# Patient Record
Sex: Male | Born: 1935 | Race: White | Hispanic: No | Marital: Married | Smoking: Former smoker
Health system: Southern US, Community
[De-identification: ages and names within clinical notes are randomized; demographics above are authoritative.]

## PROBLEM LIST (undated history)

## (undated) DIAGNOSIS — C3490 Malignant neoplasm of unspecified part of unspecified bronchus or lung: Secondary | ICD-10-CM

## (undated) DIAGNOSIS — F419 Anxiety disorder, unspecified: Secondary | ICD-10-CM

## (undated) DIAGNOSIS — E785 Hyperlipidemia, unspecified: Secondary | ICD-10-CM

## (undated) DIAGNOSIS — M1990 Unspecified osteoarthritis, unspecified site: Secondary | ICD-10-CM

## (undated) DIAGNOSIS — I2510 Atherosclerotic heart disease of native coronary artery without angina pectoris: Secondary | ICD-10-CM

## (undated) DIAGNOSIS — I739 Peripheral vascular disease, unspecified: Secondary | ICD-10-CM

## (undated) DIAGNOSIS — I10 Essential (primary) hypertension: Secondary | ICD-10-CM

## (undated) DIAGNOSIS — T7840XA Allergy, unspecified, initial encounter: Secondary | ICD-10-CM

## (undated) DIAGNOSIS — C7951 Secondary malignant neoplasm of bone: Secondary | ICD-10-CM

## (undated) HISTORY — PX: TRANSURETHRAL RESECTION OF PROSTATE: SHX73

## (undated) HISTORY — DX: Peripheral vascular disease, unspecified: I73.9

## (undated) HISTORY — DX: Hyperlipidemia, unspecified: E78.5

## (undated) HISTORY — DX: Essential (primary) hypertension: I10

## (undated) HISTORY — PX: HERNIA REPAIR: SHX51

## (undated) HISTORY — DX: Atherosclerotic heart disease of native coronary artery without angina pectoris: I25.10

## (undated) HISTORY — PX: ABDOMINAL AORTIC ANEURYSM REPAIR: SUR1152

## (undated) HISTORY — PX: CARDIAC CATHETERIZATION: SHX172

---

## 1998-08-28 ENCOUNTER — Ambulatory Visit (HOSPITAL_COMMUNITY): Admission: RE | Admit: 1998-08-28 | Discharge: 1998-08-28 | Payer: Self-pay | Admitting: Dentistry

## 1998-08-28 ENCOUNTER — Encounter: Payer: Self-pay | Admitting: Dentistry

## 2004-02-17 ENCOUNTER — Ambulatory Visit: Admission: RE | Admit: 2004-02-17 | Discharge: 2004-02-17 | Payer: Self-pay | Admitting: Vascular Surgery

## 2004-02-23 ENCOUNTER — Emergency Department (HOSPITAL_COMMUNITY): Admission: EM | Admit: 2004-02-23 | Discharge: 2004-02-23 | Payer: Self-pay | Admitting: Emergency Medicine

## 2004-02-26 ENCOUNTER — Ambulatory Visit (HOSPITAL_COMMUNITY): Admission: RE | Admit: 2004-02-26 | Discharge: 2004-02-26 | Payer: Self-pay | Admitting: Vascular Surgery

## 2004-03-08 ENCOUNTER — Inpatient Hospital Stay (HOSPITAL_BASED_OUTPATIENT_CLINIC_OR_DEPARTMENT_OTHER): Admission: RE | Admit: 2004-03-08 | Discharge: 2004-03-08 | Payer: Self-pay | Admitting: *Deleted

## 2004-03-10 ENCOUNTER — Encounter (INDEPENDENT_AMBULATORY_CARE_PROVIDER_SITE_OTHER): Payer: Self-pay | Admitting: Specialist

## 2004-03-10 ENCOUNTER — Inpatient Hospital Stay (HOSPITAL_COMMUNITY): Admission: RE | Admit: 2004-03-10 | Discharge: 2004-03-16 | Payer: Self-pay | Admitting: Vascular Surgery

## 2004-06-01 ENCOUNTER — Ambulatory Visit: Payer: Self-pay | Admitting: Internal Medicine

## 2004-06-30 ENCOUNTER — Ambulatory Visit: Payer: Self-pay | Admitting: Internal Medicine

## 2004-08-11 ENCOUNTER — Ambulatory Visit: Payer: Self-pay | Admitting: Internal Medicine

## 2004-09-22 ENCOUNTER — Ambulatory Visit: Payer: Self-pay | Admitting: Internal Medicine

## 2004-10-06 ENCOUNTER — Ambulatory Visit: Payer: Self-pay | Admitting: Internal Medicine

## 2004-12-22 ENCOUNTER — Ambulatory Visit: Payer: Self-pay | Admitting: Internal Medicine

## 2005-02-02 ENCOUNTER — Ambulatory Visit: Payer: Self-pay | Admitting: Internal Medicine

## 2005-02-18 ENCOUNTER — Ambulatory Visit: Payer: Self-pay | Admitting: Internal Medicine

## 2005-03-28 ENCOUNTER — Ambulatory Visit: Payer: Self-pay | Admitting: Internal Medicine

## 2005-06-23 ENCOUNTER — Ambulatory Visit: Payer: Self-pay | Admitting: Internal Medicine

## 2005-06-29 ENCOUNTER — Ambulatory Visit: Payer: Self-pay | Admitting: Internal Medicine

## 2005-10-26 ENCOUNTER — Ambulatory Visit: Payer: Self-pay | Admitting: Internal Medicine

## 2006-03-22 ENCOUNTER — Ambulatory Visit: Payer: Self-pay | Admitting: Internal Medicine

## 2006-04-12 ENCOUNTER — Ambulatory Visit: Payer: Self-pay | Admitting: Internal Medicine

## 2006-04-12 LAB — CONVERTED CEMR LAB
ALT: 28 units/L (ref 0–40)
AST: 26 units/L (ref 0–37)
Albumin: 4.2 g/dL (ref 3.5–5.2)
Bilirubin, Direct: 0.1 mg/dL (ref 0.0–0.3)
Calcium: 9.4 mg/dL (ref 8.4–10.5)
Chloride: 106 meq/L (ref 96–112)
GFR calc non Af Amer: 69 mL/min
Glomerular Filtration Rate, Af Am: 84 mL/min/{1.73_m2}
Glucose, Bld: 101 mg/dL — ABNORMAL HIGH (ref 70–99)
PSA: 0.36 ng/mL (ref 0.10–4.00)
Platelets: 134 10*3/uL — ABNORMAL LOW (ref 150–400)
RBC: 5.38 M/uL (ref 4.22–5.81)
RDW: 12.2 % (ref 11.5–14.6)
Sodium: 142 meq/L (ref 135–145)

## 2006-10-11 ENCOUNTER — Ambulatory Visit: Payer: Self-pay | Admitting: Internal Medicine

## 2006-12-26 ENCOUNTER — Encounter: Payer: Self-pay | Admitting: Internal Medicine

## 2006-12-26 ENCOUNTER — Ambulatory Visit: Payer: Self-pay | Admitting: Internal Medicine

## 2006-12-26 DIAGNOSIS — E785 Hyperlipidemia, unspecified: Secondary | ICD-10-CM

## 2006-12-26 DIAGNOSIS — I739 Peripheral vascular disease, unspecified: Secondary | ICD-10-CM

## 2006-12-26 DIAGNOSIS — I2510 Atherosclerotic heart disease of native coronary artery without angina pectoris: Secondary | ICD-10-CM

## 2006-12-26 DIAGNOSIS — I10 Essential (primary) hypertension: Secondary | ICD-10-CM | POA: Insufficient documentation

## 2006-12-26 HISTORY — DX: Essential (primary) hypertension: I10

## 2006-12-26 HISTORY — DX: Atherosclerotic heart disease of native coronary artery without angina pectoris: I25.10

## 2006-12-26 HISTORY — DX: Hyperlipidemia, unspecified: E78.5

## 2006-12-26 HISTORY — DX: Peripheral vascular disease, unspecified: I73.9

## 2007-03-14 ENCOUNTER — Telehealth (INDEPENDENT_AMBULATORY_CARE_PROVIDER_SITE_OTHER): Payer: Self-pay | Admitting: *Deleted

## 2007-04-23 ENCOUNTER — Ambulatory Visit: Payer: Self-pay | Admitting: Internal Medicine

## 2007-06-04 ENCOUNTER — Ambulatory Visit: Payer: Self-pay | Admitting: Vascular Surgery

## 2007-10-29 ENCOUNTER — Ambulatory Visit: Payer: Self-pay | Admitting: Internal Medicine

## 2008-03-12 ENCOUNTER — Telehealth: Payer: Self-pay | Admitting: Internal Medicine

## 2008-04-24 ENCOUNTER — Telehealth: Payer: Self-pay | Admitting: Internal Medicine

## 2008-05-05 ENCOUNTER — Telehealth: Payer: Self-pay | Admitting: Internal Medicine

## 2008-05-08 ENCOUNTER — Telehealth: Payer: Self-pay | Admitting: Internal Medicine

## 2008-05-27 ENCOUNTER — Ambulatory Visit: Payer: Self-pay | Admitting: Internal Medicine

## 2008-07-31 ENCOUNTER — Ambulatory Visit: Payer: Self-pay | Admitting: Internal Medicine

## 2008-07-31 DIAGNOSIS — J069 Acute upper respiratory infection, unspecified: Secondary | ICD-10-CM | POA: Insufficient documentation

## 2008-10-22 ENCOUNTER — Ambulatory Visit: Payer: Self-pay | Admitting: Internal Medicine

## 2009-03-12 ENCOUNTER — Telehealth: Payer: Self-pay | Admitting: Internal Medicine

## 2009-03-27 ENCOUNTER — Encounter: Payer: Self-pay | Admitting: Internal Medicine

## 2009-06-01 ENCOUNTER — Ambulatory Visit: Payer: Self-pay | Admitting: Internal Medicine

## 2009-06-23 ENCOUNTER — Telehealth: Payer: Self-pay | Admitting: Internal Medicine

## 2009-11-30 ENCOUNTER — Ambulatory Visit: Payer: Self-pay | Admitting: Internal Medicine

## 2010-06-03 ENCOUNTER — Ambulatory Visit: Payer: Self-pay | Admitting: Internal Medicine

## 2010-06-03 ENCOUNTER — Encounter: Payer: Self-pay | Admitting: Internal Medicine

## 2010-06-03 LAB — CONVERTED CEMR LAB
ALT: 24 units/L (ref 0–53)
AST: 24 units/L (ref 0–37)
Albumin: 3.7 g/dL (ref 3.5–5.2)
BUN: 21 mg/dL (ref 6–23)
Basophils Relative: 0.3 % (ref 0.0–3.0)
Chloride: 105 meq/L (ref 96–112)
Cholesterol: 134 mg/dL (ref 0–200)
Eosinophils Relative: 1.6 % (ref 0.0–5.0)
GFR calc non Af Amer: 66.37 mL/min (ref >60.00)
HCT: 46.8 % (ref 39.0–52.0)
Hemoglobin: 16.1 g/dL (ref 13.0–17.0)
LDL Cholesterol: 67 mg/dL (ref 0–99)
Lymphs Abs: 1.4 10*3/uL (ref 0.7–4.0)
MCV: 91.8 fL (ref 78.0–100.0)
Monocytes Absolute: 1.2 10*3/uL — ABNORMAL HIGH (ref 0.1–1.0)
Monocytes Relative: 8.3 % (ref 3.0–12.0)
Neutro Abs: 11.2 10*3/uL — ABNORMAL HIGH (ref 1.4–7.7)
Potassium: 3.9 meq/L (ref 3.5–5.1)
Sodium: 143 meq/L (ref 135–145)
TSH: 1.54 microintl units/mL (ref 0.35–5.50)
Total Protein: 6.2 g/dL (ref 6.0–8.3)
VLDL: 18.2 mg/dL (ref 0.0–40.0)
WBC: 14 10*3/uL — ABNORMAL HIGH (ref 4.5–10.5)

## 2010-07-18 LAB — CONVERTED CEMR LAB
ALT: 22 units/L (ref 0–53)
ALT: 24 units/L (ref 0–53)
ALT: 26 units/L (ref 0–53)
AST: 25 units/L (ref 0–37)
AST: 28 units/L (ref 0–37)
Albumin: 3.9 g/dL (ref 3.5–5.2)
Albumin: 3.9 g/dL (ref 3.5–5.2)
Albumin: 4.1 g/dL (ref 3.5–5.2)
Alkaline Phosphatase: 56 units/L (ref 39–117)
Alkaline Phosphatase: 60 units/L (ref 39–117)
Alkaline Phosphatase: 69 units/L (ref 39–117)
BUN: 19 mg/dL (ref 6–23)
BUN: 25 mg/dL — ABNORMAL HIGH (ref 6–23)
Basophils Absolute: 0 10*3/uL (ref 0.0–0.1)
Basophils Relative: 0.3 % (ref 0.0–3.0)
Basophils Relative: 0.5 % (ref 0.0–3.0)
Bilirubin, Direct: 0.1 mg/dL (ref 0.0–0.3)
Bilirubin, Direct: 0.2 mg/dL (ref 0.0–0.3)
CO2: 32 meq/L (ref 19–32)
Calcium: 9.5 mg/dL (ref 8.4–10.5)
Chloride: 103 meq/L (ref 96–112)
Chloride: 104 meq/L (ref 96–112)
Chloride: 106 meq/L (ref 96–112)
Cholesterol: 137 mg/dL (ref 0–200)
Creatinine, Ser: 1.1 mg/dL (ref 0.4–1.5)
Eosinophils Absolute: 0.1 10*3/uL (ref 0.0–0.6)
Eosinophils Absolute: 0.2 10*3/uL (ref 0.0–0.7)
Eosinophils Absolute: 0.2 10*3/uL (ref 0.0–0.7)
Eosinophils Relative: 2.1 % (ref 0.0–5.0)
Eosinophils Relative: 2.7 % (ref 0.0–5.0)
GFR calc Af Amer: 76 mL/min
GFR calc non Af Amer: 63 mL/min
GFR calc non Af Amer: 69 mL/min
Glucose, Bld: 92 mg/dL (ref 70–99)
HCT: 46.8 % (ref 39.0–52.0)
HDL: 45.8 mg/dL (ref >39.0)
HDL: 53.2 mg/dL (ref >39.0)
Hemoglobin: 16.4 g/dL (ref 13.0–17.0)
Hemoglobin: 16.7 g/dL (ref 13.0–17.0)
LDL Cholesterol: 61 mg/dL (ref 0–99)
LDL Cholesterol: 61 mg/dL (ref 0–99)
Lymphocytes Relative: 22.1 % (ref 12.0–46.0)
MCHC: 35.1 g/dL (ref 30.0–36.0)
MCV: 90.9 fL (ref 78.0–100.0)
MCV: 92 fL (ref 78.0–100.0)
MCV: 92.1 fL (ref 78.0–100.0)
Monocytes Absolute: 0.7 10*3/uL (ref 0.1–1.0)
Monocytes Relative: 10.4 % (ref 3.0–12.0)
Monocytes Relative: 10.8 % (ref 3.0–11.0)
Neutro Abs: 4.5 10*3/uL (ref 1.4–7.7)
Neutro Abs: 4.6 10*3/uL (ref 1.4–7.7)
Neutrophils Relative %: 68.8 % (ref 43.0–77.0)
PSA: 0.43 ng/mL (ref 0.10–4.00)
PSA: 0.48 ng/mL (ref 0.10–4.00)
Platelets: 116 10*3/uL — ABNORMAL LOW (ref 150–400)
Potassium: 4.1 meq/L (ref 3.5–5.1)
RBC: 5.08 M/uL (ref 4.22–5.81)
RBC: 5.1 M/uL (ref 4.22–5.81)
Sodium: 144 meq/L (ref 135–145)
Total CHOL/HDL Ratio: 2.8
Total CHOL/HDL Ratio: 3
Total Protein: 6.7 g/dL (ref 6.0–8.3)
Total Protein: 6.8 g/dL (ref 6.0–8.3)
Triglycerides: 102 mg/dL (ref 0.0–149.0)
Triglycerides: 115 mg/dL (ref 0–149)
Triglycerides: 83 mg/dL (ref 0–149)
WBC: 6.8 10*3/uL (ref 4.5–10.5)
WBC: 7 10*3/uL (ref 4.5–10.5)

## 2010-07-20 NOTE — Assessment & Plan Note (Signed)
Summary: 6 month rov/njr    Vital Signs:  Patient profile:   77 year old male Weight:      177 pounds Temp:     97.6 degrees F oral BP sitting:   100 / 60  (right arm) Cuff size:   regular  Vitals Entered By: Duard Brady LPN (November 30, 2009 10:28 AM) CC: 6 mos rov - doing ok Is Patient Diabetic? No   CC:  6 mos rov - doing ok.  History of Present Illness: 77 year old patient who is in today for follow-up.  In.  He has a history of hypertension, coronary artery disease, peripheral that her occlusive disease and dyslipidemia.  He denies any exertional chest pain and denies any claudication.  His medications were reviewed.  Denies any orthostatic symptoms.  Blood pressure on arrival today was 100/60  Allergies (verified): No Known Drug Allergies  Past History:  Past Medical History: Reviewed history from 05/27/2008 and no changes required. Coronary artery disease history 80%  circumflex lesion via catheterizatio  2005 Hypertension Peripheral vascular disease Hyperlipidemia  Past Surgical History: Reviewed history from 05/27/2008 and no changes required. Abdominal aortic aneurysm repair September 2005 Transurethral resection of prostate Inguinal herniorrhaphy cardiac catheterization, September 2005  Family History: Reviewed history from 04/23/2007 and no changes required. both parents died at age 30 mother history hypertension, and diabetes two younger brothers are in good health  Review of Systems  The patient denies anorexia, fever, weight loss, weight gain, vision loss, decreased hearing, hoarseness, chest pain, syncope, dyspnea on exertion, peripheral edema, prolonged cough, headaches, hemoptysis, abdominal pain, melena, hematochezia, severe indigestion/heartburn, hematuria, incontinence, genital sores, muscle weakness, suspicious skin lesions, transient blindness, difficulty walking, depression, unusual weight change, abnormal bleeding, enlarged lymph nodes,  angioedema, breast masses, and testicular masses.    Physical Exam  General:  Well-developed,well-nourished,in no acute distress; alert,appropriate and cooperative throughout examination; 110/64 Head:  Normocephalic and atraumatic without obvious abnormalities. No apparent alopecia or balding. Eyes:  No corneal or conjunctival inflammation noted. EOMI. Perrla. Funduscopic exam benign, without hemorrhages, exudates or papilledema. Vision grossly normal. Mouth:  Oral mucosa and oropharynx without lesions or exudates.  Teeth in good repair. Neck:  No deformities, masses, or tenderness noted. Lungs:  Normal respiratory effort, chest expands symmetrically. Lungs are clear to auscultation, no crackles or wheezes. Heart:  Normal rate and regular rhythm. S1 and S2 normal without gallop, murmur, click, rub or other extra sounds. Abdomen:  Bowel sounds positive,abdomen soft and non-tender without masses, organomegaly or hernias noted. Msk:  No deformity or scoliosis noted of thoracic or lumbar spine.   Pulses:  R and L carotid,radial,femoral,dorsalis pedis and posterior tibial pulses are full and equal bilaterally Extremities:  No clubbing, cyanosis, edema, or deformity noted with normal full range of motion of all joints.   Skin:  Intact without suspicious lesions or rashes Cervical Nodes:  No lymphadenopathy noted Axillary Nodes:  No palpable lymphadenopathy   Impression & Recommendations:  Problem # 1:  HYPERLIPIDEMIA (ICD-272.4)  His updated medication list for this problem includes:    Simvastatin 20 Mg Tabs (Simvastatin) .Marland Kitchen... Take 1 tablet by mouth at bedtime  His updated medication list for this problem includes:    Simvastatin 20 Mg Tabs (Simvastatin) .Marland Kitchen... Take 1 tablet by mouth at bedtime  Problem # 2:  PERIPHERAL VASCULAR DISEASE (ICD-443.9)  Problem # 3:  HYPERTENSION (ICD-401.9)  His updated medication list for this problem includes:    Cardura 4 Mg Tabs (Doxazosin mesylate)  .Marland KitchenMarland KitchenMarland KitchenMarland Kitchen  1 once daily    Hydrochlorothiazide 25 Mg Tabs (Hydrochlorothiazide) .Marland Kitchen... Take 1/2  tablet by mouth once a day    Lopressor 100 Mg Tabs (Metoprolol tartrate) .Marland Kitchen... Take 1/2 tablet by mouth twice a day    His updated medication list for this problem includes:    Cardura 4 Mg Tabs (Doxazosin mesylate) .Marland Kitchen... 1 once daily    Hydrochlorothiazide 25 Mg Tabs (Hydrochlorothiazide) .Marland Kitchen... Take 1/2  tablet by mouth once a day    Lopressor 100 Mg Tabs (Metoprolol tartrate) .Marland Kitchen... Take 1/2 tablet by mouth twice a day  Problem # 4:  CORONARY ARTERY DISEASE (ICD-414.00)  His updated medication list for this problem includes:    Aspirin 81 Mg Tbec (Aspirin) .Marland Kitchen... Take 1 tablet by mouth every morning    Cardura 4 Mg Tabs (Doxazosin mesylate) .Marland Kitchen... 1 once daily    Hydrochlorothiazide 25 Mg Tabs (Hydrochlorothiazide) .Marland Kitchen... Take 1/2  tablet by mouth once a day    Lopressor 100 Mg Tabs (Metoprolol tartrate) .Marland Kitchen... Take 1/2 tablet by mouth twice a day    His updated medication list for this problem includes:    Aspirin 81 Mg Tbec (Aspirin) .Marland Kitchen... Take 1 tablet by mouth every morning    Cardura 4 Mg Tabs (Doxazosin mesylate) .Marland Kitchen... 1 once daily    Hydrochlorothiazide 25 Mg Tabs (Hydrochlorothiazide) .Marland Kitchen... Take 1/2  tablet by mouth once a day    Lopressor 100 Mg Tabs (Metoprolol tartrate) .Marland Kitchen... Take 1/2 tablet by mouth twice a day  Complete Medication List: 1)  Aspirin 81 Mg Tbec (Aspirin) .... Take 1 tablet by mouth every morning 2)  Cardura 4 Mg Tabs (Doxazosin mesylate) .Marland Kitchen.. 1 once daily 3)  Hydrochlorothiazide 25 Mg Tabs (Hydrochlorothiazide) .... Take 1/2  tablet by mouth once a day 4)  Lopressor 100 Mg Tabs (Metoprolol tartrate) .... Take 1/2 tablet by mouth twice a day 5)  Simvastatin 20 Mg Tabs (Simvastatin) .... Take 1 tablet by mouth at bedtime 6)  Temazepam 15 Mg Caps (Temazepam) .... One tab at bedtime as needed  Other Orders: Prescription Created Electronically [phone])  Patient  Instructions: 1)  Please schedule a follow-up appointment in 6 months for CPX  2)  Advised not to eat any food or drink any liquids after 10 PM the night before your procedure. 3)  Limit your Sodium (Salt) to less than 2 grams a day(slightly less than 1/2 a teaspoon) to prevent fluid retention, swelling, or worsening of symptoms. 4)  It is important that you exercise regularly at least 20 minutes 5 times a week. If you develop chest pain, have severe difficulty breathing, or feel very tired , stop exercising immediately and seek medical attention. Prescriptions: TEMAZEPAM 15 MG  CAPS (TEMAZEPAM) one tab at bedtime as needed  #90 x 4   Entered and Authorized by:   Gordy Savers  MD   Signed by:   Gordy Savers  MD on 11/30/2009   Method used:   Print then Give to Patient   RxID:   [id number] SIMVASTATIN 20 MG TABS (SIMVASTATIN) Take 1 tablet by mouth at bedtime  #90 x 6   Entered and Authorized by:   Gordy Savers  MD   Signed by:   Gordy Savers  MD on 11/30/2009   Method used:   Electronically to        [facility]* (retail)             ,  Ph: [phone]       Fax: [phone]   RxID:   [id number] LOPRESSOR 100 MG TABS (METOPROLOL TARTRATE) Take 1/2 tablet by mouth twice a day  #90 x 6   Entered and Authorized by:   Gordy Savers  MD   Signed by:   Gordy Savers  MD on 11/30/2009   Method used:   Electronically to        [facility]* (retail)             ,          Ph: [phone]       Fax: [phone]   RxID:   [id number] HYDROCHLOROTHIAZIDE 25 MG TABS (HYDROCHLOROTHIAZIDE) Take 1/2  tablet by mouth once a day  #90 x 6   Entered and Authorized by:   Gordy Savers  MD   Signed by:   Gordy Savers  MD on 11/30/2009   Method used:   Electronically to        [facility]* (retail)             ,          Ph: [phone]       Fax: [phone]   RxID:   [id number] CARDURA 4 MG TABS  (DOXAZOSIN MESYLATE) 1 once daily  #90 x 6   Entered and Authorized by:   Gordy Savers  MD   Signed by:   Gordy Savers  MD on 11/30/2009   Method used:   Electronically to        [facility]* (retail)             ,          Ph: [phone]       Fax: [phone]   RxID:   [id number]

## 2010-07-20 NOTE — Progress Notes (Signed)
Summary: [facility] fax rx  Phone Note Call from Patient Call back at [phone]   Caller: Patient Call For: Gavin Savers  MD Summary of Call: pt needs name brand cardura 4 mg no generic fax into [facility] [phone] Initial call taken by: Heron Sabins,  June 23, 2009 4:07 PM    Prescriptions: CARDURA 4 MG TABS (DOXAZOSIN MESYLATE) 1 once daily  #90 x 3   Entered by:   Raechel Ache, RN   Authorized by:   Gavin Savers  MD   Signed by:   Raechel Ache, RN on 06/24/2009   Method used:   Printed then faxed to ...       [facility]* (mail-order)             ,          Ph: [phone]       Fax: [phone]   RxID:   [id number]

## 2010-07-22 NOTE — Assessment & Plan Note (Signed)
Summary: emp/pt coming in fasting/cjr   Vital Signs:  Patient profile:   77 year old male Height:      65.25 inches Weight:      179 pounds Temp:     97.9 degrees F oral BP sitting:   116 / 70  (right arm) Cuff size:   regular  Vitals Entered By: Duard Brady LPN (June 03, 2010 9:21 AM) CC: cpx- doing ok Is Patient Diabetic? No   CC:  cpx- doing ok.  History of Present Illness: 77 year old patient who is seen today for a wellness exam.  Medical problems include dyslipidemia, peripheral vascular disease, hypertension, and coronary artery disease.  He is now well tonight without any cardiopulmonary complaints.  Medical regimen includes simvastatin, which he tolerates well.\par  Here for Medicare AWV:  1.   Risk factors based on Past M, S, F history: patient has known coronary artery disease.  Risk factors include peripheral vascular disease, hypertension, and dyslipidemia 2.   Physical Activities: remains very active.  Continues to play golf occasionally 3.   Depression/mood: history depression, or mood disorder 4.   Hearing: wears a hearing aid bilaterally 5.   ADL's: independent in all aspects of daily living 6.   Fall Risk: low 7.   Home Safety: no problems identified 8.   Height, weight, &visual acuity:height and weight are stable.  No change in visual acuity 9.   Counseling: heart healthy diet more regular exercise and modest weight loss.  All encouraged 10.   Labs ordered based on risk factors: laboratory profile, including lipid panel will be reviewed 11.           Referral Coordination- not appropriate at this time 12.           Care Plan- heart healthy diet, exercise, weight loss encouraged 13.            Cognitive Assessment- alert and oriented, with normal affect.  No cognitive dysfunction.  handles all executive functions in the household   Allergies (verified): No Known Drug Allergies  Past History:  Past Medical History: Reviewed history from  05/27/2008 and no changes required. Coronary artery disease history 80%  circumflex lesion via catheterizatio  2005 Hypertension Peripheral vascular disease Hyperlipidemia  Past Surgical History: Reviewed history from 05/27/2008 and no changes required. Abdominal aortic aneurysm repair September 2005 Transurethral resection of prostate Inguinal herniorrhaphy cardiac catheterization, September 2005  Family History: Reviewed history from 04/23/2007 and no changes required. both parents died at age 91 mother history hypertension, and diabetes two younger brothers are in good health  Social History: Reviewed history from 05/27/2008 and no changes required. Married active with golf  Review of Systems  The patient denies anorexia, fever, weight loss, weight gain, vision loss, decreased hearing, hoarseness, chest pain, syncope, dyspnea on exertion, peripheral edema, prolonged cough, headaches, hemoptysis, abdominal pain, melena, hematochezia, severe indigestion/heartburn, hematuria, incontinence, genital sores, muscle weakness, suspicious skin lesions, transient blindness, difficulty walking, depression, unusual weight change, abnormal bleeding, enlarged lymph nodes, angioedema, breast masses, and testicular masses.    Physical Exam  General:  Well-developed,well-nourished,in no acute distress; alert,appropriate and cooperative throughout examination; blood pressure 116/72 Head:  Normocephalic and atraumatic without obvious abnormalities. No apparent alopecia or balding. Eyes:  No corneal or conjunctival inflammation noted. EOMI. Perrla. Funduscopic exam benign, without hemorrhages, exudates or papilledema. Vision grossly normal. Ears:  hearing aids  in place bilaterally; these were removed, canals and tympanic membranes unremarkable Nose:  External nasal examination shows no deformity  or inflammation. Nasal mucosa are pink and moist without lesions or exudates. Mouth:  Oral mucosa and  oropharynx without lesions or exudates.  Teeth in good repair. Neck:  No deformities, masses, or tenderness noted. Chest Wall:  No deformities, masses, tenderness or gynecomastia noted. Breasts:  No masses or gynecomastia noted Lungs:  Normal respiratory effort, chest expands symmetrically. Lungs are clear to auscultation, no crackles or wheezes. Heart:  Normal rate and regular rhythm. S1 and S2 normal without gallop, murmur, click, rub or other extra sounds. Abdomen:  Bowel sounds positive,abdomen soft and non-tender without masses, organomegaly or hernias noted.  midline scar with a ventral hernia Rectal:  No external abnormalities noted. Normal sphincter tone. No rectal masses or tenderness. Genitalia:  Testes bilaterally descended without nodularity, tenderness or masses. No scrotal masses or lesions. No penis lesions or urethral discharge. Prostate:  Prostate gland firm and smooth, no enlargement, nodularity, tenderness, mass, asymmetry or induration. Msk:  No deformity or scoliosis noted of thoracic or lumbar spine.   Pulses:  left dorsalis pedis pulse full.  Other pedal pulses not easily palpable Extremities:  No clubbing, cyanosis, edema, or deformity noted with normal full range of motion of all joints.   Neurologic:  No cranial nerve deficits noted. Station and gait are normal. Plantar reflexes are down-going bilaterally. DTRs are symmetrical throughout. Sensory, motor and coordinative functions appear intact. Skin:  Intact without suspicious lesions or rashes Cervical Nodes:  No lymphadenopathy noted Axillary Nodes:  No palpable lymphadenopathy Inguinal Nodes:  No significant adenopathy Psych:  Cognition and judgment appear intact. Alert and cooperative with normal attention span and concentration. No apparent delusions, illusions, hallucinations   Impression & Recommendations:  Problem # 1:  HEALTH SCREENING (ICD-V70.0)  Orders: Medicare -1st Annual Wellness Visit  [phone])  Complete Medication List: 1)  Aspirin 81 Mg Tbec (Aspirin) .... Take 1 tablet by mouth every morning 2)  Cardura 4 Mg Tabs (Doxazosin mesylate) .Marland Kitchen.. 1 once daily 3)  Hydrochlorothiazide 25 Mg Tabs (Hydrochlorothiazide) .... Take 1/2  tablet by mouth once a day 4)  Lopressor 100 Mg Tabs (Metoprolol tartrate) .... Take 1/2 tablet by mouth twice a day 5)  Simvastatin 20 Mg Tabs (Simvastatin) .... Take 1 tablet by mouth at bedtime 6)  Temazepam 15 Mg Caps (Temazepam) .... One tab at bedtime as needed  Other Orders: EKG w/ Interpretation (93000) Venipuncture (86578) TLB-Lipid Panel (80061-LIPID) TLB-BMP (Basic Metabolic Panel-BMET) (80048-METABOL) TLB-CBC Platelet - w/Differential (85025-CBCD) TLB-Hepatic/Liver Function Pnl (80076-HEPATIC) TLB-TSH (Thyroid Stimulating Hormone) (84443-TSH) Specimen Handling (46962)  Patient Instructions: 1)  Please schedule a follow-up appointment in 6 months. 2)  Limit your Sodium (Salt). 3)  It is important that you exercise regularly at least 20 minutes 5 times a week. If you develop chest pain, have severe difficulty breathing, or feel very tired , stop exercising immediately and seek medical attention. 4)  You need to lose weight. Consider a lower calorie diet and regular exercise.  Prescriptions: SIMVASTATIN 20 MG TABS (SIMVASTATIN) Take 1 tablet by mouth at bedtime  #90 x 6   Entered and Authorized by:   Gordy Savers  MD   Signed by:   Gordy Savers  MD on 06/03/2010   Method used:   Electronically to        [facility]* (retail)             ,          Ph: [phone]       Fax:  [phone]   RxID:   [id number] LOPRESSOR 100 MG TABS (METOPROLOL TARTRATE) Take 1/2 tablet by mouth twice a day  #90 x 6   Entered and Authorized by:   Gordy Savers  MD   Signed by:   Gordy Savers  MD on 06/03/2010   Method used:   Electronically to        [facility]* (retail)             ,          Ph:  [phone]       Fax: [phone]   RxID:   [id number] HYDROCHLOROTHIAZIDE 25 MG TABS (HYDROCHLOROTHIAZIDE) Take 1/2  tablet by mouth once a day  #90 x 6   Entered and Authorized by:   Gordy Savers  MD   Signed by:   Gordy Savers  MD on 06/03/2010   Method used:   Electronically to        [facility]* (retail)             ,          Ph: [phone]       Fax: [phone]   RxID:   [id number] CARDURA 4 MG TABS (DOXAZOSIN MESYLATE) 1 once daily Brand medically necessary #90 Tablet x 2   Entered and Authorized by:   Gordy Savers  MD   Signed by:   Gordy Savers  MD on 06/03/2010   Method used:   Electronically to        [facility]* (retail)             ,          Ph: [phone]       Fax: [phone]   RxID:   [id number]    Orders Added: 1)  EKG w/ Interpretation [93000] 2)  Medicare -1st Annual Wellness Visit [G0438] 3)  Est. Patient Level III [85462] 4)  Venipuncture [36415] 5)  TLB-Lipid Panel [80061-LIPID] 6)  TLB-BMP (Basic Metabolic Panel-BMET) [80048-METABOL] 7)  TLB-CBC Platelet - w/Differential [85025-CBCD] 8)  TLB-Hepatic/Liver Function Pnl [80076-HEPATIC] 9)  TLB-TSH (Thyroid Stimulating Hormone) [84443-TSH] 10)  Specimen Handling [99000]

## 2010-10-05 ENCOUNTER — Other Ambulatory Visit: Payer: Self-pay | Admitting: Internal Medicine

## 2010-10-05 MED ORDER — TEMAZEPAM 15 MG PO CAPS: 15.0000 mg | ORAL_CAPSULE | Freq: Every evening | ORAL | Status: DC | PRN

## 2010-10-05 NOTE — Telephone Encounter (Signed)
Pt called and said that Temazepam 15 mg, needs to be sent in to [facility] mail order pharmacy asap. Pt also req lab result and would like to pick up asap.

## 2010-10-05 NOTE — Telephone Encounter (Signed)
Spoke to pt - rx's for both him and wife faxed to [facility]

## 2010-11-05 NOTE — Op Note (Signed)
NAME:  [name], [name]               ACCOUNT NO.:  [id number]   MEDICAL RECORD NO.:  [id number]          PATIENT TYPE:  INP   LOCATION:  2314                         FACILITY:  [facility]   PHYSICIAN:  Quita Skye. Hart Rochester, M.D.  DATE OF BIRTH:  1928-12-02   DATE OF PROCEDURE:  03/10/2004  DATE OF DISCHARGE:                                 OPERATIVE REPORT   /PREOPERATIVE DIAGNOSES/>  1.  Abdominal aortic aneurysm..  2.  Bilateral iliac aneurysms.   POSTOPERATIVE DIAGNOSES:  1.  Abdominal aortic aneurysm..  2.  Bilateral iliac aneurysms.   OPERATION:  Resection and grafting of abdominal aortic and bilateral iliac  aneurysms with insertion of an aorta-to-left-external-iliac and aorta-to-  right-internal-iliac-artery bypass with reimplantation of right external  artery into the right limb of the aortic graft (14 x 8-mm Hemashield).   SURGEON:  Quita Skye. Hart Rochester, M.D.   FIRST ASSISTANT:  Larina Earthly, M.D.   SECOND ASSISTANT:  Pecola Leisure, [place]   ANESTHESIA:  General endotracheal.   PROCEDURE:  The patient was taken to the operating room and placed in a  supine position, at which time satisfactory general endotracheal anesthesia  was administered.  Abdomen and groins were prepped with Betadine scrubbing  solution and draped in routine sterile manner after insertion of a radial  arterial line and Swan-Ganz catheter by anesthesia.  A midline incision was  made in the abdomen through the linea alba from the xiphoid to pubis,  carried through subcutaneous tissue and the linea alba using the Bovie.  Peritoneal cavity was entered and thoroughly explored.  The stomach,  duodenum, small bowel and colon were unremarkable.  The liver was smooth  with no palpable masses.  The gallbladder was small, but no stones were  palpable.  Transverse colon was elevated and intestines reflected to the  right side; retroperitoneum was exposed, where there was a large infrarenal  aortic aneurysm approximately  5.5 cm in diameter and a large right common  iliac aneurysm which extended down to and involved the origin of the  internal iliac artery.  The left common iliac artery had a smaller aneurysm,  being only about 2.5 in diameter, the right one being about 4 cm in  diameter.  The left external iliac artery was exposed in the pelvis  distally, circumferential control of the left common iliac artery was  obtained and the right internal and external iliac arteries were exposed  through the midline incision proximal to the ureter.  The patient was then  given 25 g of mannitol and heparinized.  Aorta was occluded distal to the  renal arteries, the left common iliac artery ligated just proximal to the  internal iliac artery, which was noted to be occluded on the angiogram.  The  aneurysm was opened anteriorly and a large amount of laminated thrombus  removed with lumbars oversewn with 2-0 silk figure-of-eight sutures from  within.  There was also a large amount of laminated thrombus in the right  common iliac artery aneurysm which was removed.  There was significant  disease down to and involving the origin of  the internal iliac artery,  making a bypass into both vessels with 1 graft not feasible.  There was  concern over the pelvic circulation with the left internal iliac artery  being thrombosed and the right being involved with the disease, therefore,  it was decided to revascularize both on the right side.  A 14 x 8-mm  Hemashield Dacron graft was anastomosed end-to-end to the aortic stump using  continuous 3-0 Prolene, buttressing this with a strip of felt.  This was  checked for leaks and none were present.  On the left side, the graft was  delivered posterior to the ureter, anastomosis end-to-side to the left  external iliac artery with 5-0 Prolene, left leg opened with no significant  hypotension.  On the right side, the internal and external iliac arteries  were both preserved and slightly  spatulated, and the right limb of the  aortic graft was anastomosis end-to-end to the right internal iliac artery  with continuous 5-0 Prolene.  The right external iliac artery having been  spatulated was then anastomosed end-to-side to the anterior aspect of the  right limb of the aortobifemoral graft with 5-0 Prolene.  Clamps were all  released and there was an excellent pulse in all areas.  Inferior mesenteric  artery had retrograde pulsatile flow when this was completed and it was  ligated with 2-0 silk tie.  One hundred milligrams of Protamine were given  to reverse the heparin and following adequate hemostasis, aneurysm closed  over the graft with 3-0 Vicryl, retroperitoneum closed with 3-0 Vicryl,  linea alba with #1 Prolene and the skin with clips, a sterile dressing  applied and patient taken to the recovery room in satisfactory condition.  He was stable hemodynamically throughout the case, received no blood from  the blood blank and had an excellent urinary output.       JDL/MEDQ  D:  03/10/2004  T:  03/11/2004  Job:  [id number]

## 2010-11-05 NOTE — Assessment & Plan Note (Signed)
 HEALTHCARE                              [facility] OFFICE NOTE   NAME:[name], [name]                      MRN:          [id number]  DATE:04/12/2006                            DOB:          07/11/1928   A 77 year old gentleman who was seen today for an annual exam.  He does  remarkably well.  He is now approximately 2 years status post triple A  repair. He has a history of 1 vessel coronary artery disease with 80%  circumflex lesion.  He is doing well clinically. He has hypertension, on  statin therapy.  Surgical history include remote TURP in 1994 by Dr.  Patsi Sears.  He has had a hernia repair by Dr. Maryagnes Amos, heart catheterization  was at the time of his aneurysmectomy in September 2005.  He denies any  cardiopulmonary complaints. He remains quite active with golf twice weekly.   REVIEW OF SYSTEMS:  Otherwise unremarkable. Did have sigmoidoscopy in 2002.   FAMILY HISTORY:  Unchanged.  Both parents died at 53.  Two brothers remain  well.   EXAMINATION:  Revealed a well-developed male who appeared younger than his  stated age.  Blood pressure is low normal.  SKIN:  Negative.  Fundi, ear, nose and throat clear.  NECK:  No bruits or adenopathy.  CHEST: Was clear.  CARDIOVASCULAR:  Normal heart sounds, no murmurs.  ABDOMEN:  Benign.  No bruits.  External genitalia normal.  RECTAL:  Prostate minimally enlarged.  Stool heme negative.  EXTREMITIES:  Revealed no significant edema. Peripheral pulses were slight  diminished, left greater than the right.   IMPRESSION:  1. Peripheral vascular occlusive disease status post triple A repair.  2. Coronary artery disease.  3. Hypertension.  4. Statin therapy.   DISPOSITION:  1. Medical regimen unchanged.  2. Reassessment in 6 months.  3. Laboratory update reviewed.            ______________________________  Gordy Savers, MD   PFK/MedQ DD:  04/12/2006 DT:  04/13/2006 Job #:  [id number]

## 2010-11-05 NOTE — Discharge Summary (Signed)
[name], [name] NO.:  [id number]   MEDICAL RECORD NO.:  [id number]          PATIENT TYPE:  INP   LOCATION:  2036                         FACILITY:  [facility]   PHYSICIAN:  [name], M.D.  DATE OF BIRTH:  05/20/1929   DATE OF ADMISSION:  03/10/2004  DATE OF DISCHARGE:                                 DISCHARGE SUMMARY   ADMITTING DIAGNOSIS:  Asymptomatic abdominal aortic aneurysm.   PAST MEDICAL HISTORY/DISCHARGE DIAGNOSES:  1.  Asymptomatic abdominal aortic aneurysm, status post resection grafting      of abdominal aortic aneurysm with 14 x 8 mm Hemashield graft as well as      resection grafting of right iliac aneurysm.  2.  Hypertension.  3.  Right inguinal hernia repair approximately 15 years ago.  4.  TURP approximately 11 years ago.   ALLERGIES:  No known drug allergies.   BRIEF HISTORY:  The patient is a 77 year old male referred by Dr.  Amador Sutton to Dr. Hart Sutton for evaluation of an asymptomatic abdominal aortic  aneurysm.  The patient was found to have an AAA on routine physical exam  approximately one month prior to admission.  An ultrasound was performed  which confirmed a 6.2 x 5.6 cm aneurysm.  The patient has been asymptomatic  and is very active.   Dr. Hart Sutton recommended an arteriogram, which he performed on February 26, 2004 with the following findings:  An infrarenal abdominal aortic aneurysm, bilateral common iliac artery  aneurysms, occlusion of the left internal iliac artery and severe disease  and occlusion of the right posterior tibial artery.  It was Dr. Candie Sutton  opinion that the patient should proceed with the elective resection grafting  of these aneurysms.   HOSPITAL COURSE:  The patient was admitted and taken to the OR on March 10, 2004 for resection grafting of abdominal aortic and bilateral iliac  aneurysms with insertion of an aortic to the left external iliac and aortic  to right internal iliac artery bypass  with re-implantation of the right  external artery into the right limb of the aortic graft.  A 14 x 8 mm  Hemashield graft was used.  The patient tolerated the procedure well and was  hemodynamically stable immediately postoperatively.  The patient was  transferred from the OR to the post anesthesia care unit in stable  condition.  The patient was extubated without complications and woke up from  anesthesia neurologically intact.   The patient's postoperative course did progress as expected.  Immediately  postoperatively the patient was awake, but drowsy, and he was doing well  after the surgery.  The patient had 2+ posterior tibial pulses.   On postoperative day one, the patient again had 2 to 3+ palpable posterior  tibial pulses bilaterally.  The patient's urine output was excellent and his  NG tube was discontinued.  The patient was kept n.p.o. for the time being.  His __________ was discontinued and the patient was able to move out of bed  to the chair.   The remainder of the patient's postoperative course has been uneventful.  The patient's diet was advanced and he tolerated this well.  The patient was  also ambulating well in hallways without difficulty.  The patient's bowel  function has returned and his only complaint has been some mild left lower  quadrant soreness.   On postoperative day five, the patient had return of bowel function and was  tolerating a full diet.  He is afebrile and vital signs are stable.   PHYSICAL EXAMINATION:  CARDIAC:  Regular rate and rhythm.  LUNGS:  Clear to auscultation bilaterally.  ABDOMEN:  Soft, nontender and nondistended.  There are active bowel sounds  in four quadrants.  The incision is clean, dry and intact and there are 2+  palpable posterior tibial pulses present bilaterally.   The patient is in stable condition and is making good progress.  He is  tolerating his diet and ambulating without difficulty.  The patient's  Cardura was  restarted at this time.  As long as the patient continues to  progress in the current manner and remains in stable condition, he will be  ready for discharge within the next one to two days pending morning round  reevaluation.   LABORATORIES:  CBC and BMP on March 15, 2004:  White count 6.9,  hemoglobin 10.5, hematocrit 29.8, platelets 186, sodium 142, potassium 3.6,  BUN 13, creatinine 0.9, glucose 124.   CONDITION ON DISCHARGE:  Improved.   INSTRUCTIONS:  Medications:  1.  The patient is to resume his Cardura 4 mg p.o. q. day.  2.  Tylox one to two p.o. q. 4 to 6h. p.r.n. pain.   ACTIVITY:  No driving and the patient is to continue daily breathing and  walking exercises.   DIET:  Low-salt, low-fat.   WOUND CARE:  The patient may shower daily and clean the incisions with soap  and water.  If the incisions become red, swollen or drain or if the patient  has a fever of 101 degrees Fahrenheit, he is to contact the [facility] office at  [phone].   FOLLOW-UP APPOINTMENTS:  1.  Staple removal with the nurse at [facility] on March 22, 2004 at 9:30 a.m.  2.  Dr. Hart Sutton on April 06, 2004 at 10:30 a.m.       AY/MEDQ  D:  03/15/2004  T:  03/15/2004  Job:  [id number]   cc:   [name], N.M.  [address]  [place]  [place] [address]  Fax: [phone]   [name], M.D. [facility]

## 2010-11-05 NOTE — Cardiovascular Report (Signed)
NAME:  GEHRIG, PATRAS                         ACCOUNT NO.:  [id number]   MEDICAL RECORD NO.:  [id number]                   PATIENT TYPE:  OIB   LOCATION:  6501                                 FACILITY:  [facility]   PHYSICIAN:  Charlies Constable, M.D. [facility]              DATE OF BIRTH:  10-30-1928   DATE OF PROCEDURE:  03/08/2004  DATE OF DISCHARGE:                              CARDIAC CATHETERIZATION   CLINICAL HISTORY:  Mr. Jeschke is 77 years old and has no prior history of  known heart disease.  He was recently found to have an abdominal aneurysm on  exam by Dr. Amador Cunas which subsequently measured 6.2 x 5.6 cm.  We were  asked to see him for preop evaluation and he had a Cardiolite scan which  suggested inferior ischemia.  We brought him in today for evaluation with  angiography in the outpatient laboratory.  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   PROCEDURE:  The procedure was performed via the right femoral artery using  arterial sheath and 6 French preformed coronary catheters. A front wall  arterial puncture was performed and Omnipaque contrast was used.  The  patient tolerated the procedure well and left the laboratory in satisfactory  condition.   RESULTS:  Left main coronary artery:  The left main coronary artery had a  30% distal stenosis.   Left anterior descending artery:  The left anterior descending artery gave  rise to two diagonal branches and four septal perforators.  There were  irregularities in the LAD, but there was no significant obstruction.   Circumflex artery:  The circumflex artery gave rise to an atrial branch,  marginal branch and a small posterior lateral branch.  There was 80% ostial  stenosis in the circumflex artery.   Right coronary artery:  The right coronary artery was a moderate size vessel  and gave rise to a conus branch, right ventricular branch, second acute  marginal branch and supplied part of the inferior septum,  short posterior  descending branch and three posterior lateral branches.  These vessels were  free of significant disease.   LEFT VENTRICULOGRAM:  The left ventriculogram performed in the RAO  projection showed good wall motion with no areas of hypokinesis.  The  estimated ejection fraction was 55%.   HEMODYNAMIC DATA:  The aortic pressure was 138/79 with mean of 105, left  ventricular pressure was 138/6.   CONCLUSIONS:  Coronary artery disease with 30% narrowing of the distal left  main, irregularities in the left anterior descending artery, 80% ostial  stenosis in the circumflex artery, no significant obstruction in the right  coronary artery and normal LV function.   RECOMMENDATIONS:  The lesion in the ostium of the circumflex artery is not  particularly favorable for PCI.  There is no clear evidence that  revascularization will decrease his risk prior to surgery for his  abdominal  aortic aneurysm.  Since he is not having any symptoms, I would recommend  medical therapy for his coronary disease and recommend proceeding with  abdominal aneurysm surgery giving beta blocker pre and postoperatively.      BB/MEDQ  D:  03/08/2004  T:  03/08/2004  Job:  [id number]   cc:   Gordy Savers, M.D. [facility]   Fayrene Fearing D. Hart Rochester, M.D.  [address]  [place]  [place] [address]

## 2010-11-05 NOTE — H&P (Signed)
NAME:  [name], [name]                         ACCOUNT NO.:  [id number]   MEDICAL RECORD NO.:  [id number]                   PATIENT TYPE:  INP   LOCATION:  NA                                   FACILITY:  [facility]   PHYSICIAN:  Quita Skye. Hart Rochester, M.D.               DATE OF BIRTH:  October 16, 1928   DATE OF ADMISSION:  03/03/2004  DATE OF DISCHARGE:                                HISTORY & PHYSICAL   CHIEF COMPLAINT:  Asymptomatic abdominal aortic aneurysm.   HISTORY OF PRESENT ILLNESS:  This is a 77 year old male patient of Dr.  Amador Cunas referred to Dr. Hart Rochester for a surgical opinion.  The patient was  found approximately one month ago to have, on routine physical examination,  a large abdominal aortic aneurysm.  Ultrasound confirmed this as a 6.2 by  5.6 cm aneurysm.  The patient has been essentially asymptomatic.  He is very  active and has no signs of claudication, back pain, abdominal pain, nausea  and vomiting, constipation, hematochezia, hematemesis, peripheral edema,  dysuria, hematuria, reflux, shortness of breath, dyspnea on exertion, or  chest pain.  Arteriogram performed by Dr. Hart Rochester on February 26, 2004,  showed several findings:  Infrarenal abdominal aortic aneurysm, bilateral  common iliac artery aneurysms, occlusion of the left internal iliac artery,  severe disease and occlusion of the right posterior tibial artery.  It was  Dr. Candie Chroman opinion that the patient should undergo elective resection and  grafting of the aneurysms.   PAST MEDICAL HISTORY:  Significant for hypertension.  Denies coronary artery  disease, hyperlipidemia, previous DVT, previous pulmonary embolism, history  of CVA or TIA, history of COPD, although he is a former smoker.  No history  of diabetes or thyroid disease.   PAST SURGICAL HISTORY:  1.  He had a right inguinal hernia repair approximately 15 years ago by Dr.      Maryagnes Amos.  2.  TURP approximately 11 years ago.   CURRENT MEDICATIONS:  Cardura 4  mg daily.   ALLERGIES:  No known drug allergies.   REVIEW OF SYMPTOMS:  Please see the history of present illness for  significant positives, otherwise, full system review is unremarkable with  the exception of increased deafness.   FAMILY HISTORY:  Mother with diabetes.   SOCIAL HISTORY:  Married, has four children.  He currently owns a delivery  service.  No alcohol use.  Tobacco use:  He quit approximately 19 years ago.  Total use was approximately 25 years, although he is uncertain of the exact  totals.   PHYSICAL EXAMINATION:  VITAL SIGNS:  Blood pressure 122/84, pulse 84 and regular, respirations 16  and unlabored.  GENERAL:  This is a 77 year old white male in no acute distress, alert and  oriented x 3.  HEENT:  Normocephalic, atraumatic, pupils equal, round, reactive to light,  extraocular movements intact, there is significant cerumen blocking the  tympanic membranes in  both external canals, pharynx is clear, he has good  dentition for age.  NECK:  Supple, no carotid bruits, no jugular venous distention, no  lymphadenopathy.  PULMONARY EXAMINATION:  Breath sounds symmetrical on inspiration, no  wheezes, rhonchi, or rales.  CARDIAC EXAMINATION:  Regular rate and rhythm.  No murmurs, gallops, and  rubs.  Normal S1 and S2.  GENITOURINARY AND RECTAL:  Deferred.  EXTREMITIES:  No cyanosis, clubbing, and edema, no ulcerations.  Skin  temperature is warm.  Peripheral pulses equal and intact bilaterally.  NEUROLOGICAL:  Nonfocal.   ASSESSMENT:  77 year old male with a large infrarenal abdominal aortic  aneurysm and other findings as described.   PLAN:  Resection and grafting on March 03, 2004, per Dr. Hart Rochester.  Other  plans will be determined based on interoperative findings.      Rowe Clack, P.A.-C.                    Quita Skye Hart Rochester, M.D.    Sherryll Burger  D:  03/02/2004  T:  03/02/2004  Job:  [id number]   cc:   Gordy Savers, M.D. [facility]

## 2010-11-05 NOTE — Op Note (Signed)
NAME:  [name], [name]                         ACCOUNT NO.:  [id number]   MEDICAL RECORD NO.:  [id number]                   PATIENT TYPE:  EMS   LOCATION:  MAJO                                 FACILITY:  [facility]   PHYSICIAN:  Quita Skye. Hart Rochester, M.D.               DATE OF BIRTH:  March 09, 1929   DATE OF PROCEDURE:  02/26/2004  DATE OF DISCHARGE:  02/23/2004                                 OPERATIVE REPORT   PREOPERATIVE DIAGNOSIS:  Abdominal aortic aneurysm.   POSTOPERATIVE DIAGNOSIS:  Abdominal aortic aneurysm.   OPERATION PERFORMED:  Biplane abdominal aortogram with bilateral lower  extremity runoff and bilateral pelvic angiography via right common femoral  approach.   SURGEON:  Quita Skye. Hart Rochester, M.D.   ANESTHESIA:  Local.   CONTRAST:  178 mL.   COMPLICATIONS:  None.   DESCRIPTION OF PROCEDURE:  The patient was taken to the [facility]  peripheral endovascular lab and placed in supine position at which time both  groins were prepped with Betadine scrub and solution and draped in routine  sterile manner.  After infiltration with 1% Xylocaine, the right common  femoral artery was entered percutaneously.  Guidewire passed into the  suprarenal aorta under fluoroscopic guidance.  A 5 French sheath and dilator  were passed over the guidewire, dilator removed and standard graduated  pigtail catheter positioned in the suprarenal aorta.  A flush abdominal  aortogram was performed injecting 30 mL of contrast at 20 mL per second.  This revealed the aorta to be widely patent with single renal arteries  bilaterally with no significant stenoses.  There was a long neck below the  renal arteries with a diffuse infrarenal aortic aneurysm.  This extended  down to and involved both common iliac arteries.  There was a focal  aneurysmal dilatation of the left common iliac artery with total occlusion  of the left internal iliac artery.  There was a long right common iliac  artery aneurysm and a  very ectatic right iliac system with a patent right  internal iliac artery.  A lateral aortogram was performed injecting 20 mL of  contrast at 20 mL per second.  This revealed there to be very slight  anterior angulation of the neck of the aneurysm and a widely patent superior  mesenteric artery.  The celiac axis was not visualized.  Catheter was then  withdrawn into the terminal aorta and bilateral lower extremity runoff was  performed injecting 88 mL of contrast at 8 mL per second.  This revealed  both external iliac and common femoral systems to be widely patent.  On the  right, the superficial femoral artery was patent with some mild irregularity  distally and the right popliteal artery was patent across the knee.  There  was severe disease involving the right posterior tibial artery with total  occlusion proximally but a widely patent anterior tibial and peroneal artery  on  the right.  The left superficial femoral artery had similar mild diffuse  disease but was widely patent with a popliteal artery patent on the left and  three vessel runoff on the left.  Additional views of the iliac vessels were  performed using RAO and LAO projections and injecting 20 mL of contrast on  each occasion, getting better definition of the internal iliac artery on the  right and the occluded internal iliac artery on the left.  Having tolerated  the procedure well, the catheter was removed over the guidewire, sheath  removed, adequate compression applied.  No complications ensued.   FINDINGS:  1.  Infrarenal abdominal aortic aneurysm.  2.  Bilateral common iliac artery aneurysms.  3.  Occlusion of left internal iliac artery.  4.  Severe disease and occlusion of right posterior tibial artery.                                               Quita Skye Hart Rochester, M.D.    JDL/MEDQ  D:  02/26/2004  T:  02/26/2004  Job:  [id number]

## 2010-12-01 ENCOUNTER — Encounter: Payer: Self-pay | Admitting: Internal Medicine

## 2010-12-03 ENCOUNTER — Ambulatory Visit (INDEPENDENT_AMBULATORY_CARE_PROVIDER_SITE_OTHER): Payer: Medicare Other | Admitting: Internal Medicine

## 2010-12-03 ENCOUNTER — Encounter: Payer: Self-pay | Admitting: Internal Medicine

## 2010-12-03 DIAGNOSIS — I2510 Atherosclerotic heart disease of native coronary artery without angina pectoris: Secondary | ICD-10-CM

## 2010-12-03 DIAGNOSIS — E785 Hyperlipidemia, unspecified: Secondary | ICD-10-CM

## 2010-12-03 DIAGNOSIS — I739 Peripheral vascular disease, unspecified: Secondary | ICD-10-CM

## 2010-12-03 DIAGNOSIS — I10 Essential (primary) hypertension: Secondary | ICD-10-CM

## 2010-12-03 NOTE — Progress Notes (Signed)
  Subjective:    Patient ID: [name], male    DOB: Sep 02, 1928, 77 y.o.   MRN: [id number]  HPI   Wt Readings from Last 3 Encounters:  12/03/10 174 lb (78.926 kg)  06/03/10 179 lb (81.194 kg)  11/30/09 177 lb (80.287 kg)     Review of Systems     Objective:   Physical Exam        Assessment & Plan:

## 2010-12-03 NOTE — Progress Notes (Signed)
  Subjective:    Patient ID: [name], male    DOB: 10/19/1928, 77 y.o.   MRN: [id number]  HPI 77 year old patient who is seen today for his six-month followup. He has a history of coronary artery disease as well as PAD. He has treated hypertension and dyslipidemia. He is doing quite well and denies any claudication or any cardiopulmonary complaints. His only complaint is some occasional popping in the left ear. He does use hearing aids bilaterally.    Review of Systems  Constitutional: Negative for fever, chills, appetite change and fatigue.  HENT: Negative for hearing loss, ear pain, congestion, sore throat, trouble swallowing, neck stiffness, dental problem, voice change and tinnitus.   Eyes: Negative for pain, discharge and visual disturbance.  Respiratory: Negative for cough, chest tightness, wheezing and stridor.   Cardiovascular: Negative for chest pain, palpitations and leg swelling.  Gastrointestinal: Negative for nausea, vomiting, abdominal pain, diarrhea, constipation, blood in stool and abdominal distention.  Genitourinary: Negative for urgency, hematuria, flank pain, discharge, difficulty urinating and genital sores.  Musculoskeletal: Negative for myalgias, back pain, joint swelling, arthralgias and gait problem.  Skin: Negative for rash.  Neurological: Negative for dizziness, syncope, speech difficulty, weakness, numbness and headaches.  Hematological: Negative for adenopathy. Does not bruise/bleed easily.  Psychiatric/Behavioral: Negative for behavioral problems and dysphoric mood. The patient is not nervous/anxious.        Objective:   Physical Exam  Constitutional: He is oriented to person, place, and time. He appears well-developed.  HENT:  Head: Normocephalic.  Right Ear: External ear normal.  Left Ear: External ear normal.  Eyes: Conjunctivae and EOM are normal.  Neck: Normal range of motion.  Cardiovascular: Normal rate and normal heart sounds.     Pulmonary/Chest: Breath sounds normal.  Abdominal: Bowel sounds are normal.  Musculoskeletal: Normal range of motion. He exhibits no edema and no tenderness.  Neurological: He is alert and oriented to person, place, and time.  Psychiatric: He has a normal mood and affect. His behavior is normal.          Assessment & Plan:   Hypertension stable Dyslipidemia stable Coronary artery disease. Asymptomatic  The patient has a physical scheduled in December will obtain laboratory update at that time. Medical regimen unchanged

## 2010-12-03 NOTE — Patient Instructions (Signed)
Limit your sodium (Salt) intake    It is important that you exercise regularly, at least 20 minutes 3 to 4 times per week.  If you develop chest pain or shortness of breath seek  medical attention.  You need to lose weight.  Consider a lower calorie diet and regular exercise. 

## 2011-06-06 ENCOUNTER — Ambulatory Visit (INDEPENDENT_AMBULATORY_CARE_PROVIDER_SITE_OTHER): Payer: Medicare Other | Admitting: Internal Medicine

## 2011-06-06 ENCOUNTER — Encounter: Payer: Self-pay | Admitting: Internal Medicine

## 2011-06-06 DIAGNOSIS — I2510 Atherosclerotic heart disease of native coronary artery without angina pectoris: Secondary | ICD-10-CM

## 2011-06-06 DIAGNOSIS — E785 Hyperlipidemia, unspecified: Secondary | ICD-10-CM

## 2011-06-06 DIAGNOSIS — I10 Essential (primary) hypertension: Secondary | ICD-10-CM

## 2011-06-06 DIAGNOSIS — Z0000 Encounter for general adult medical examination without abnormal findings: Secondary | ICD-10-CM

## 2011-06-06 DIAGNOSIS — I739 Peripheral vascular disease, unspecified: Secondary | ICD-10-CM

## 2011-06-06 LAB — LIPID PANEL
Cholesterol: 123 mg/dL (ref 0–200)
VLDL: 16.8 mg/dL (ref 0.0–40.0)

## 2011-06-06 LAB — COMPREHENSIVE METABOLIC PANEL
Albumin: 4 g/dL (ref 3.5–5.2)
Alkaline Phosphatase: 64 U/L (ref 39–117)
BUN: 23 mg/dL (ref 6–23)
CO2: 30 mEq/L (ref 19–32)
Calcium: 9 mg/dL (ref 8.4–10.5)
Chloride: 106 mEq/L (ref 96–112)
GFR: 70.51 mL/min (ref >60.00)
Glucose, Bld: 94 mg/dL (ref 70–99)
Potassium: 3.5 mEq/L (ref 3.5–5.1)

## 2011-06-06 LAB — CBC WITH DIFFERENTIAL/PLATELET
Basophils Relative: 0.5 % (ref 0.0–3.0)
Eosinophils Relative: 2 % (ref 0.0–5.0)
Lymphocytes Relative: 21.6 % (ref 12.0–46.0)
Monocytes Relative: 10.5 % (ref 3.0–12.0)
Neutrophils Relative %: 65.4 % (ref 43.0–77.0)
Platelets: 114 10*3/uL — ABNORMAL LOW (ref 150.0–400.0)
RBC: 4.94 Mil/uL (ref 4.22–5.81)
WBC: 7.1 10*3/uL (ref 4.5–10.5)

## 2011-06-06 LAB — TSH: TSH: 1.72 u[IU]/mL (ref 0.35–5.50)

## 2011-06-06 MED ORDER — DOXAZOSIN MESYLATE 4 MG PO TABS: 4.0000 mg | ORAL_TABLET | Freq: Every day | ORAL | Status: DC

## 2011-06-06 MED ORDER — TEMAZEPAM 15 MG PO CAPS: 15.0000 mg | ORAL_CAPSULE | Freq: Every evening | ORAL | Status: DC | PRN

## 2011-06-06 MED ORDER — HYDROCHLOROTHIAZIDE 25 MG PO TABS: 12.5000 mg | ORAL_TABLET | Freq: Every day | ORAL | Status: DC

## 2011-06-06 MED ORDER — METOPROLOL TARTRATE 100 MG PO TABS: 50.0000 mg | ORAL_TABLET | Freq: Two times a day (BID) | ORAL | Status: DC

## 2011-06-06 MED ORDER — SIMVASTATIN 20 MG PO TABS: 20.0000 mg | ORAL_TABLET | Freq: Every day | ORAL | Status: DC

## 2011-06-06 NOTE — Patient Instructions (Signed)
Limit your sodium (Salt) intake    It is important that you exercise regularly, at least 20 minutes 3 to 4 times per week.  If you develop chest pain or shortness of breath seek  medical attention.  Return in 6 months for follow-up  

## 2011-06-06 NOTE — Progress Notes (Signed)
Subjective:    Patient ID: [name], male    DOB: 01/07/1929, 77 y.o.   MRN: [id number]  HPI  History of Present Illness:   77 year-old patient who is seen today for a wellness exam. Medical problems include dyslipidemia, peripheral vascular disease, hypertension, and coronary artery disease. He is now well tonight without any cardiopulmonary complaints. Medical regimen includes simvastatin, which he tolerates well.  Here for Medicare AWV:   1. Risk factors based on Past M, S, F history: patient has known coronary artery disease. Risk factors include peripheral vascular disease, hypertension, and dyslipidemia  2. Physical Activities: remains very active. Continues to play golf occasionally  3. Depression/mood: history depression, or mood disorder  4. Hearing: wears a hearing aid bilaterally  5. ADL's: independent in all aspects of daily living  6. Fall Risk: low  7. Home Safety: no problems identified  8. Height, weight, &visual acuity:height and weight are stable. No change in visual acuity  9. Counseling: heart healthy diet more regular exercise and modest weight loss. All encouraged  10. Labs ordered based on risk factors: laboratory profile, including lipid panel will be reviewed  11. Referral Coordination- not appropriate at this time  12. Care Plan- heart healthy diet, exercise, weight loss encouraged  13. Cognitive Assessment- alert and oriented, with normal affect. No cognitive dysfunction. handles all executive functions in the household   Allergies (verified):  No Known Drug Allergies   Past History:  Past Medical History:  Reviewed history from 05/27/2008 and no changes required.  Coronary artery disease history 80% circumflex lesion via catheterizatio 2005  Hypertension  Peripheral vascular disease  Hyperlipidemia   Past Surgical History:  Reviewed history from 05/27/2008 and no changes required.  Abdominal aortic aneurysm repair September 2005  Transurethral  resection of prostate  Inguinal herniorrhaphy  cardiac catheterization, September 2005   Family History:  Reviewed history from 04/23/2007 and no changes required.  both parents died at age 46 mother history hypertension, and diabetes  two younger brothers are in good health   Social History:  Reviewed history from 05/27/2008 and no changes required.  Married  active with golf    Review of Systems  Constitutional: Negative for fever, chills, activity change, appetite change and fatigue.  HENT: Positive for hearing loss. Negative for ear pain, congestion, rhinorrhea, sneezing, mouth sores, trouble swallowing, neck pain, neck stiffness, dental problem, voice change, sinus pressure and tinnitus.   Eyes: Negative for photophobia, pain, redness and visual disturbance.  Respiratory: Negative for apnea, cough, choking, chest tightness, shortness of breath and wheezing.   Cardiovascular: Negative for chest pain, palpitations and leg swelling.  Gastrointestinal: Negative for nausea, vomiting, abdominal pain, diarrhea, constipation, blood in stool, abdominal distention, anal bleeding and rectal pain.  Genitourinary: Negative for dysuria, urgency, frequency, hematuria, flank pain, decreased urine volume, discharge, penile swelling, scrotal swelling, difficulty urinating, genital sores and testicular pain.  Musculoskeletal: Negative for myalgias, back pain, joint swelling, arthralgias and gait problem.  Skin: Negative for color change, rash and wound.  Neurological: Negative for dizziness, tremors, seizures, syncope, facial asymmetry, speech difficulty, weakness, light-headedness, numbness and headaches.  Hematological: Negative for adenopathy. Does not bruise/bleed easily.  Psychiatric/Behavioral: Negative for suicidal ideas, hallucinations, behavioral problems, confusion, sleep disturbance, self-injury, dysphoric mood, decreased concentration and agitation. The patient is not nervous/anxious.         Objective:   Physical Exam  Constitutional: He appears well-developed and well-nourished.  HENT:  Head: Normocephalic and atraumatic.  Right Ear:  External ear normal.  Left Ear: External ear normal.  Nose: Nose normal.  Mouth/Throat: Oropharynx is clear and moist.       Minimal wax left canal Hearing aid present on the right  Eyes: Conjunctivae and EOM are normal. Pupils are equal, round, and reactive to light. No scleral icterus.  Neck: Normal range of motion. Neck supple. No JVD present. No thyromegaly present.  Cardiovascular: Regular rhythm and normal heart sounds.  Exam reveals no gallop and no friction rub.   No murmur heard.      Pedal pulses absent except for an intact left posterior tibial pulse  Pulmonary/Chest: Effort normal and breath sounds normal. He exhibits no tenderness.  Abdominal: Soft. Bowel sounds are normal. He exhibits no distension and no mass. There is no tenderness.       Midline surgical scar  Genitourinary: Penis normal. Guaiac negative stool.       Status post prostatectomy  Musculoskeletal: Normal range of motion. He exhibits no edema and no tenderness.  Lymphadenopathy:    He has no cervical adenopathy.  Neurological: He is alert. He has normal reflexes. No cranial nerve deficit. Coordination normal.  Skin: Skin is warm and dry. No rash noted.  Psychiatric: He has a normal mood and affect. His behavior is normal.          Assessment & Plan:   Preventive health examination Peripheral vascular disease Status post AAA repair Dyslipidemia  We'll continue present regimen. This includes aspirin and statin therapy Laboratory studies will be reviewed

## 2011-06-30 ENCOUNTER — Other Ambulatory Visit: Payer: Self-pay | Admitting: Dermatology

## 2011-07-25 ENCOUNTER — Telehealth: Payer: Self-pay | Admitting: Internal Medicine

## 2011-07-25 NOTE — Telephone Encounter (Signed)
Please mail copy of 05-2011 blood work to pt

## 2011-07-27 NOTE — Telephone Encounter (Signed)
Labs mailed to pt's home address.

## 2011-11-25 ENCOUNTER — Encounter: Payer: Self-pay | Admitting: Internal Medicine

## 2011-12-05 ENCOUNTER — Ambulatory Visit (INDEPENDENT_AMBULATORY_CARE_PROVIDER_SITE_OTHER): Payer: Medicare Other | Admitting: Internal Medicine

## 2011-12-05 ENCOUNTER — Encounter: Payer: Self-pay | Admitting: Internal Medicine

## 2011-12-05 VITALS — BP 114/70 | Temp 97.8°F | Wt 165.0 lb

## 2011-12-05 DIAGNOSIS — I739 Peripheral vascular disease, unspecified: Secondary | ICD-10-CM

## 2011-12-05 DIAGNOSIS — I10 Essential (primary) hypertension: Secondary | ICD-10-CM

## 2011-12-05 DIAGNOSIS — I2510 Atherosclerotic heart disease of native coronary artery without angina pectoris: Secondary | ICD-10-CM

## 2011-12-05 DIAGNOSIS — E785 Hyperlipidemia, unspecified: Secondary | ICD-10-CM

## 2011-12-05 MED ORDER — SIMVASTATIN 20 MG PO TABS: 20.0000 mg | ORAL_TABLET | Freq: Every day | ORAL | Status: DC

## 2011-12-05 MED ORDER — METOPROLOL TARTRATE 100 MG PO TABS: 50.0000 mg | ORAL_TABLET | Freq: Two times a day (BID) | ORAL | Status: DC

## 2011-12-05 MED ORDER — HYDROCHLOROTHIAZIDE 25 MG PO TABS: 12.5000 mg | ORAL_TABLET | Freq: Every day | ORAL | Status: DC

## 2011-12-05 MED ORDER — DOXAZOSIN MESYLATE 4 MG PO TABS: 4.0000 mg | ORAL_TABLET | Freq: Every day | ORAL | Status: DC

## 2011-12-05 MED ORDER — TEMAZEPAM 15 MG PO CAPS: 15.0000 mg | ORAL_CAPSULE | Freq: Every evening | ORAL | Status: DC | PRN

## 2011-12-05 NOTE — Progress Notes (Signed)
Subjective:    Patient ID: [name], male    DOB: 08-29-1928, 78 y.o.   MRN: [id number]  HPI  78 year old patient who is seen today for his biannual followup. He has a history of coronary artery disease which has been stable. Remains quite active works part-time and plays golf occasionally. No cardiopulmonary complaints. Denies any claudication. He has treated hypertension and dyslipidemia. Remains on simvastatin 20 which he continues to tolerate well. He cares for a disabled wife and states that he is unable to leave her for more than 4 hours at a time.  Past Medical History  Diagnosis Date  . CORONARY ARTERY DISEASE 12/26/2006  . HYPERLIPIDEMIA 12/26/2006  . HYPERTENSION 12/26/2006  . PERIPHERAL VASCULAR DISEASE 12/26/2006    History   Social History  . Marital Status: Married    Spouse Name: N/A    Number of Children: N/A  . Years of Education: N/A   Occupational History  . Not on file.   Social History Main Topics  . Smoking status: Former Smoker    Quit date: 06/20/1978  . Smokeless tobacco: Never Used  . Alcohol Use: Yes     rarely  . Drug Use: No  . Sexually Active: Not on file   Other Topics Concern  . Not on file   Social History Narrative  . No narrative on file    Past Surgical History  Procedure Date  . Abdominal aortic aneurysm repair   . Transurethral resection of prostate   . Hernia repair     ingunial  . Cardiac catheterization     Family History  Problem Relation Age of Onset  . Diabetes Mother   . Hypertension Mother     No Known Allergies  Current Outpatient Prescriptions on File Prior to Visit  Medication Sig Dispense Refill  . aspirin 81 MG tablet Take 81 mg by mouth daily.        Marland Kitchen doxazosin (CARDURA) 4 MG tablet Take 1 tablet (4 mg total) by mouth at bedtime.  90 tablet  6  . hydrochlorothiazide (HYDRODIURIL) 25 MG tablet Take 0.5 tablets (12.5 mg total) by mouth daily.  90 tablet  6  . metoprolol (LOPRESSOR) 100 MG tablet Take 0.5  tablets (50 mg total) by mouth 2 (two) times daily.  90 tablet  6  . simvastatin (ZOCOR) 20 MG tablet Take 1 tablet (20 mg total) by mouth at bedtime.  90 tablet  6  . temazepam (RESTORIL) 15 MG capsule Take 1 capsule (15 mg total) by mouth at bedtime as needed.  30 capsule  3  . temazepam (RESTORIL) 15 MG capsule Take 1 capsule (15 mg total) by mouth at bedtime as needed for sleep.  90 capsule  1    BP 114/70  Temp 97.8 F (36.6 C) (Oral)  Wt 165 lb (74.844 kg)       Review of Systems  Constitutional: Negative for fever, chills, appetite change and fatigue.  HENT: Negative for hearing loss, ear pain, congestion, sore throat, trouble swallowing, neck stiffness, dental problem, voice change and tinnitus.   Eyes: Negative for pain, discharge and visual disturbance.  Respiratory: Negative for cough, chest tightness, wheezing and stridor.   Cardiovascular: Negative for chest pain, palpitations and leg swelling.  Gastrointestinal: Negative for nausea, vomiting, abdominal pain, diarrhea, constipation, blood in stool and abdominal distention.  Genitourinary: Negative for urgency, hematuria, flank pain, discharge, difficulty urinating and genital sores.  Musculoskeletal: Negative for myalgias, back pain, joint swelling, arthralgias  and gait problem.  Skin: Negative for rash.  Neurological: Negative for dizziness, syncope, speech difficulty, weakness, numbness and headaches.  Hematological: Negative for adenopathy. Does not bruise/bleed easily.  Psychiatric/Behavioral: Negative for behavioral problems and dysphoric mood. The patient is not nervous/anxious.        Objective:   Physical Exam  Constitutional: He is oriented to person, place, and time. He appears well-developed.  HENT:  Head: Normocephalic.  Right Ear: External ear normal.  Left Ear: External ear normal.  Eyes: Conjunctivae and EOM are normal.  Neck: Normal range of motion.  Cardiovascular: Normal rate and normal heart  sounds.   Pulmonary/Chest: Breath sounds normal.  Abdominal: Bowel sounds are normal.  Musculoskeletal: Normal range of motion. He exhibits no edema and no tenderness.  Neurological: He is alert and oriented to person, place, and time.  Psychiatric: He has a normal mood and affect. His behavior is normal.          Assessment & Plan:   Coronary artery disease. Remains asymptomatic. Hypertension well controlled Dyslipidemia stable Peripheral vascular disease stable denies any claudication Insomnia continues to use temazepam when necessary. Will refill as necessary  Recheck 6 months Medications refilled

## 2011-12-05 NOTE — Patient Instructions (Signed)
Limit your sodium (Salt) intake    It is important that you exercise regularly, at least 20 minutes 3 to 4 times per week.  If you develop chest pain or shortness of breath seek  medical attention.  Return in 6 months for follow-up  

## 2012-03-21 ENCOUNTER — Telehealth: Payer: Self-pay

## 2012-03-21 DIAGNOSIS — H9190 Unspecified hearing loss, unspecified ear: Secondary | ICD-10-CM

## 2012-03-21 NOTE — Telephone Encounter (Signed)
[facility] tomorrow - needs referral faxed - Dr. Beckie Busing?

## 2012-06-06 ENCOUNTER — Ambulatory Visit (INDEPENDENT_AMBULATORY_CARE_PROVIDER_SITE_OTHER): Payer: Medicare Other | Admitting: Internal Medicine

## 2012-06-06 DIAGNOSIS — Z0000 Encounter for general adult medical examination without abnormal findings: Secondary | ICD-10-CM

## 2012-06-12 ENCOUNTER — Ambulatory Visit (INDEPENDENT_AMBULATORY_CARE_PROVIDER_SITE_OTHER): Payer: Medicare Other | Admitting: Internal Medicine

## 2012-06-12 ENCOUNTER — Encounter: Payer: Self-pay | Admitting: Internal Medicine

## 2012-06-12 VITALS — BP 128/70 | HR 64 | Temp 97.4°F | Resp 18 | Ht 65.5 in | Wt 171.0 lb

## 2012-06-12 DIAGNOSIS — Z0000 Encounter for general adult medical examination without abnormal findings: Secondary | ICD-10-CM

## 2012-06-12 DIAGNOSIS — E785 Hyperlipidemia, unspecified: Secondary | ICD-10-CM

## 2012-06-12 DIAGNOSIS — I2510 Atherosclerotic heart disease of native coronary artery without angina pectoris: Secondary | ICD-10-CM

## 2012-06-12 DIAGNOSIS — I10 Essential (primary) hypertension: Secondary | ICD-10-CM

## 2012-06-12 DIAGNOSIS — I739 Peripheral vascular disease, unspecified: Secondary | ICD-10-CM

## 2012-06-12 LAB — COMPREHENSIVE METABOLIC PANEL
ALT: 20 U/L (ref 0–53)
Alkaline Phosphatase: 57 U/L (ref 39–117)
Creatinine, Ser: 1.1 mg/dL (ref 0.4–1.5)
Glucose, Bld: 105 mg/dL — ABNORMAL HIGH (ref 70–99)
Sodium: 141 mEq/L (ref 135–145)
Total Bilirubin: 0.9 mg/dL (ref 0.3–1.2)
Total Protein: 6.5 g/dL (ref 6.0–8.3)

## 2012-06-12 LAB — CBC WITH DIFFERENTIAL/PLATELET
Eosinophils Relative: 2.2 % (ref 0.0–5.0)
HCT: 47.1 % (ref 39.0–52.0)
Hemoglobin: 15.8 g/dL (ref 13.0–17.0)
Lymphs Abs: 1.5 10*3/uL (ref 0.7–4.0)
MCV: 90.1 fl (ref 78.0–100.0)
Monocytes Absolute: 0.7 10*3/uL (ref 0.1–1.0)
Monocytes Relative: 9.7 % (ref 3.0–12.0)
Neutro Abs: 4.8 10*3/uL (ref 1.4–7.7)
Platelets: 111 10*3/uL — ABNORMAL LOW (ref 150.0–400.0)
WBC: 7.2 10*3/uL (ref 4.5–10.5)

## 2012-06-12 LAB — TSH: TSH: 0.5 u[IU]/mL (ref 0.35–5.50)

## 2012-06-12 LAB — LIPID PANEL
Cholesterol: 120 mg/dL (ref 0–200)
HDL: 46.7 mg/dL (ref >39.00)
Total CHOL/HDL Ratio: 3
VLDL: 22.2 mg/dL (ref 0.0–40.0)

## 2012-06-12 MED ORDER — HYDROCHLOROTHIAZIDE 25 MG PO TABS: 12.5000 mg | ORAL_TABLET | Freq: Every day | ORAL | Status: DC

## 2012-06-12 MED ORDER — TEMAZEPAM 15 MG PO CAPS: 15.0000 mg | ORAL_CAPSULE | Freq: Every evening | ORAL | Status: DC | PRN

## 2012-06-12 MED ORDER — DOXAZOSIN MESYLATE 4 MG PO TABS: 4.0000 mg | ORAL_TABLET | Freq: Every day | ORAL | Status: DC

## 2012-06-12 MED ORDER — METOPROLOL TARTRATE 100 MG PO TABS: 50.0000 mg | ORAL_TABLET | Freq: Two times a day (BID) | ORAL | Status: DC

## 2012-06-12 MED ORDER — SIMVASTATIN 20 MG PO TABS: 20.0000 mg | ORAL_TABLET | Freq: Every day | ORAL | Status: DC

## 2012-06-12 NOTE — Patient Instructions (Signed)
Limit your sodium (Salt) intake    It is important that you exercise regularly, at least 20 minutes 3 to 4 times per week.  If you develop chest pain or shortness of breath seek  medical attention.  Return in 6 months for follow-up  

## 2012-06-12 NOTE — Progress Notes (Signed)
Patient ID: [name], male   DOB: December 10, 1928, 78 y.o.   MRN: [id number]  Subjective:    Patient ID: [name], male    DOB: 1929/01/30, 78 y.o.   MRN: [id number]  HPI  History of Present Illness:   78  year-old patient who is seen today for a wellness exam. Medical problems include dyslipidemia, peripheral vascular disease, hypertension, and coronary artery disease. He is stable without any cardiopulmonary complaints. Medical regimen includes simvastatin, which he tolerates well.  Here for Medicare AWV:   1. Risk factors based on Past M, S, F history: patient has known coronary artery disease. Risk factors include peripheral vascular disease, hypertension, and dyslipidemia  2. Physical Activities: remains very active. Continues to play golf occasionally and exercises daily 3. Depression/mood: history depression, or mood disorder  4. Hearing: wears a hearing aid bilaterally  5. ADL's: independent in all aspects of daily living  6. Fall Risk: low  7. Home Safety: no problems identified  8. Height, weight, &visual acuity:height and weight are stable. No change in visual acuity  9. Counseling: heart healthy diet more regular exercise and modest weight loss. All encouraged  10. Labs ordered based on risk factors: laboratory profile, including lipid panel will be reviewed  11. Referral Coordination- not appropriate at this time  12. Care Plan- heart healthy diet, exercise, weight loss encouraged  13. Cognitive Assessment- alert and oriented, with normal affect. No cognitive dysfunction. handles all executive functions in the household   Allergies (verified):  No Known Drug Allergies   Past History:  Past Medical History:  Reviewed history from 05/27/2008 and no changes required.  Coronary artery disease history 80% circumflex lesion via catheterizatio 2005  Hypertension  Peripheral vascular disease  Hyperlipidemia   Past Surgical History:  Reviewed history from 05/27/2008 and  no changes required.  Abdominal aortic aneurysm repair September 2005  Transurethral resection of prostate  Inguinal herniorrhaphy  cardiac catheterization, September 2005   Family History:  Reviewed history from 04/23/2007 and no changes required.  both parents died at age 85 mother history hypertension, and diabetes  two younger brothers are in good health   Social History:  Reviewed history from 05/27/2008 and no changes required.  Married  active with golf    Review of Systems  Constitutional: Negative for fever, chills, activity change, appetite change and fatigue.  HENT: Positive for hearing loss. Negative for ear pain, congestion, rhinorrhea, sneezing, mouth sores, trouble swallowing, neck pain, neck stiffness, dental problem, voice change, sinus pressure and tinnitus.   Eyes: Negative for photophobia, pain, redness and visual disturbance.  Respiratory: Negative for apnea, cough, choking, chest tightness, shortness of breath and wheezing.   Cardiovascular: Negative for chest pain, palpitations and leg swelling.  Gastrointestinal: Negative for nausea, vomiting, abdominal pain, diarrhea, constipation, blood in stool, abdominal distention, anal bleeding and rectal pain.  Genitourinary: Negative for dysuria, urgency, frequency, hematuria, flank pain, decreased urine volume, discharge, penile swelling, scrotal swelling, difficulty urinating, genital sores and testicular pain.  Musculoskeletal: Negative for myalgias, back pain, joint swelling, arthralgias and gait problem.  Skin: Negative for color change, rash and wound.  Neurological: Negative for dizziness, tremors, seizures, syncope, facial asymmetry, speech difficulty, weakness, light-headedness, numbness and headaches.  Hematological: Negative for adenopathy. Does not bruise/bleed easily.  Psychiatric/Behavioral: Negative for suicidal ideas, hallucinations, behavioral problems, confusion, sleep disturbance, self-injury, dysphoric  mood, decreased concentration and agitation. The patient is not nervous/anxious.        Objective:   Physical  Exam  Constitutional: He appears well-developed and well-nourished.  HENT:  Head: Normocephalic and atraumatic.  Right Ear: External ear normal.  Left Ear: External ear normal.  Nose: Nose normal.  Mouth/Throat: Oropharynx is clear and moist.        Hearing aid present  bilaterally  Eyes: EOM are normal. Pupils are equal, round, and reactive to light. No scleral icterus.       Left conjunctival injection  Neck: Normal range of motion. Neck supple. No JVD present. No thyromegaly present.  Cardiovascular: Regular rhythm and normal heart sounds.  Exam reveals no gallop and no friction rub.   No murmur heard.      Pedal pulses absent except for an intact left posterior tibial pulse  Pulmonary/Chest: Effort normal and breath sounds normal. He exhibits no tenderness.  Abdominal: Soft. Bowel sounds are normal. He exhibits no distension and no mass. There is no tenderness.       Midline surgical scar  Genitourinary: Rectum normal, prostate normal and penis normal. Guaiac negative stool.       Status post prostatectomy  Musculoskeletal: Normal range of motion. He exhibits no edema and no tenderness.  Lymphadenopathy:    He has no cervical adenopathy.  Neurological: He is alert. He has normal reflexes. No cranial nerve deficit. Coordination normal.  Skin: Skin is warm and dry. No rash noted.  Psychiatric: He has a normal mood and affect. His behavior is normal.          Assessment & Plan:   Preventive health examination Peripheral vascular disease Status post AAA repair Dyslipidemia  We'll continue present regimen. This includes aspirin and statin therapy Laboratory studies will be reviewed

## 2012-07-10 ENCOUNTER — Other Ambulatory Visit: Payer: Self-pay | Admitting: *Deleted

## 2012-07-10 MED ORDER — DOXAZOSIN MESYLATE 4 MG PO TABS: 4.0000 mg | ORAL_TABLET | Freq: Every day | ORAL | Status: DC

## 2012-07-10 MED ORDER — TEMAZEPAM 15 MG PO CAPS: 15.0000 mg | ORAL_CAPSULE | Freq: Every evening | ORAL | Status: DC | PRN

## 2012-07-18 ENCOUNTER — Other Ambulatory Visit: Payer: Self-pay | Admitting: *Deleted

## 2012-07-18 MED ORDER — OMEPRAZOLE 40 MG PO CPDR: 40.0000 mg | DELAYED_RELEASE_CAPSULE | Freq: Every day | ORAL | Status: DC

## 2012-07-25 ENCOUNTER — Telehealth: Payer: Self-pay | Admitting: Internal Medicine

## 2012-07-25 NOTE — Telephone Encounter (Signed)
Pt states he does not know why omeprazole (PRILOSEC) 40 MG capsule was ordered for him.

## 2012-07-25 NOTE — Telephone Encounter (Signed)
Pt called back stated he does not take Omeprazole. Told pt okay do not take and I will take medication off chart and call [facility] and cancel prescription. Pt verbalized understanding.

## 2012-07-25 NOTE — Telephone Encounter (Signed)
Left message with wife to call office

## 2012-07-26 NOTE — Telephone Encounter (Signed)
Called [facility] and cancelled Rx for Omeprazole.

## 2012-08-04 ENCOUNTER — Other Ambulatory Visit: Payer: Self-pay

## 2012-10-03 NOTE — Progress Notes (Signed)
  Subjective:    Patient ID: [name], male    DOB: Oct 28, 1928, 79 y.o.   MRN: [id number]  HPI    Review of Systems     Objective:   Physical Exam        Assessment & Plan:  Pt was not seen in office, appt was cancelled

## 2012-12-03 ENCOUNTER — Inpatient Hospital Stay (HOSPITAL_COMMUNITY)
Admission: EM | Admit: 2012-12-03 | Discharge: 2012-12-06 | DRG: 482 | Disposition: A | Discharge status: Skilled Nursing Facility | Payer: Medicare Other | Attending: Internal Medicine | Admitting: Internal Medicine

## 2012-12-03 ENCOUNTER — Inpatient Hospital Stay (HOSPITAL_COMMUNITY): Payer: Medicare Other | Admitting: Certified Registered"

## 2012-12-03 ENCOUNTER — Inpatient Hospital Stay (HOSPITAL_COMMUNITY): Payer: Medicare Other

## 2012-12-03 ENCOUNTER — Emergency Department (HOSPITAL_COMMUNITY): Payer: Medicare Other

## 2012-12-03 ENCOUNTER — Encounter (HOSPITAL_COMMUNITY): Payer: Self-pay | Admitting: Certified Registered"

## 2012-12-03 ENCOUNTER — Encounter (HOSPITAL_COMMUNITY): Payer: Self-pay | Admitting: Emergency Medicine

## 2012-12-03 ENCOUNTER — Encounter (HOSPITAL_COMMUNITY)
Admission: EM | Disposition: A | Discharge status: Skilled Nursing Facility | Payer: Self-pay | Source: Home / Self Care | Attending: Internal Medicine

## 2012-12-03 DIAGNOSIS — S72142A Displaced intertrochanteric fracture of left femur, initial encounter for closed fracture: Secondary | ICD-10-CM

## 2012-12-03 DIAGNOSIS — Z87891 Personal history of nicotine dependence: Secondary | ICD-10-CM

## 2012-12-03 DIAGNOSIS — I10 Essential (primary) hypertension: Secondary | ICD-10-CM | POA: Diagnosis present

## 2012-12-03 DIAGNOSIS — I739 Peripheral vascular disease, unspecified: Secondary | ICD-10-CM | POA: Diagnosis present

## 2012-12-03 DIAGNOSIS — Z79899 Other long term (current) drug therapy: Secondary | ICD-10-CM

## 2012-12-03 DIAGNOSIS — E876 Hypokalemia: Secondary | ICD-10-CM

## 2012-12-03 DIAGNOSIS — E785 Hyperlipidemia, unspecified: Secondary | ICD-10-CM | POA: Diagnosis present

## 2012-12-03 DIAGNOSIS — E119 Type 2 diabetes mellitus without complications: Secondary | ICD-10-CM | POA: Diagnosis present

## 2012-12-03 DIAGNOSIS — S72143A Displaced intertrochanteric fracture of unspecified femur, initial encounter for closed fracture: Principal | ICD-10-CM | POA: Diagnosis present

## 2012-12-03 DIAGNOSIS — D696 Thrombocytopenia, unspecified: Secondary | ICD-10-CM | POA: Diagnosis present

## 2012-12-03 DIAGNOSIS — S72009A Fracture of unspecified part of neck of unspecified femur, initial encounter for closed fracture: Secondary | ICD-10-CM

## 2012-12-03 DIAGNOSIS — D72829 Elevated white blood cell count, unspecified: Secondary | ICD-10-CM | POA: Diagnosis present

## 2012-12-03 DIAGNOSIS — S72002A Fracture of unspecified part of neck of left femur, initial encounter for closed fracture: Secondary | ICD-10-CM

## 2012-12-03 DIAGNOSIS — Z7982 Long term (current) use of aspirin: Secondary | ICD-10-CM

## 2012-12-03 DIAGNOSIS — W010XXA Fall on same level from slipping, tripping and stumbling without subsequent striking against object, initial encounter: Secondary | ICD-10-CM | POA: Diagnosis present

## 2012-12-03 DIAGNOSIS — I2510 Atherosclerotic heart disease of native coronary artery without angina pectoris: Secondary | ICD-10-CM

## 2012-12-03 HISTORY — PX: INTRAMEDULLARY (IM) NAIL INTERTROCHANTERIC: SHX5875

## 2012-12-03 LAB — CBC WITH DIFFERENTIAL/PLATELET
Basophils Absolute: 0 10*3/uL (ref 0.0–0.1)
Basophils Relative: 0 % (ref 0–1)
Eosinophils Absolute: 0 10*3/uL (ref 0.0–0.7)
Eosinophils Relative: 0 % (ref 0–5)
HCT: 44.2 % (ref 39.0–52.0)
MCHC: 35.1 g/dL (ref 30.0–36.0)
MCV: 87.2 fL (ref 78.0–100.0)
Monocytes Absolute: 0.9 10*3/uL (ref 0.1–1.0)
Platelets: 111 10*3/uL — ABNORMAL LOW (ref 150–400)
RDW: 13 % (ref 11.5–15.5)
WBC: 13.4 10*3/uL — ABNORMAL HIGH (ref 4.0–10.5)

## 2012-12-03 LAB — BASIC METABOLIC PANEL
BUN: 22 mg/dL (ref 6–23)
CO2: 28 mEq/L (ref 19–32)
Calcium: 9.2 mg/dL (ref 8.4–10.5)
Chloride: 102 mEq/L (ref 96–112)
Creatinine, Ser: 1.03 mg/dL (ref 0.50–1.35)

## 2012-12-03 LAB — PROTIME-INR: Prothrombin Time: 14 seconds (ref 11.6–15.2)

## 2012-12-03 SURGERY — FIXATION, FRACTURE, INTERTROCHANTERIC, WITH INTRAMEDULLARY ROD
Anesthesia: General | Laterality: Left

## 2012-12-03 SURGERY — FIXATION, FRACTURE, INTERTROCHANTERIC, WITH INTRAMEDULLARY ROD
Anesthesia: Spinal | Site: Hip | Laterality: Left | Wound class: Clean

## 2012-12-03 MED ORDER — FENTANYL CITRATE 0.05 MG/ML IJ SOLN: 25.0000 ug | INTRAMUSCULAR | Status: DC | PRN

## 2012-12-03 MED ORDER — LACTATED RINGERS IV SOLN
INTRAVENOUS | Status: DC | PRN
  Administered 2012-12-03: 22:00:00 via INTRAVENOUS

## 2012-12-03 MED ORDER — CEFAZOLIN SODIUM-DEXTROSE 2-3 GM-% IV SOLR
INTRAVENOUS | Status: AC
  Filled 2012-12-03: qty 50

## 2012-12-03 MED ORDER — FENTANYL CITRATE 0.05 MG/ML IJ SOLN
50.0000 ug | Freq: Once | INTRAMUSCULAR | Status: AC
  Administered 2012-12-03: 50 ug via INTRAMUSCULAR
  Filled 2012-12-03: qty 2

## 2012-12-03 MED ORDER — FENTANYL CITRATE 0.05 MG/ML IJ SOLN
INTRAMUSCULAR | Status: DC | PRN
  Administered 2012-12-03 (×3): 50 ug via INTRAVENOUS

## 2012-12-03 MED ORDER — 0.9 % SODIUM CHLORIDE (POUR BTL) OPTIME
TOPICAL | Status: DC | PRN
  Administered 2012-12-03: 1000 mL

## 2012-12-03 MED ORDER — LIDOCAINE HCL (CARDIAC) 20 MG/ML IV SOLN
INTRAVENOUS | Status: DC | PRN
  Administered 2012-12-03: 20 mg via INTRAVENOUS

## 2012-12-03 MED ORDER — ONDANSETRON HCL 4 MG/2ML IJ SOLN
4.0000 mg | Freq: Once | INTRAMUSCULAR | Status: AC
  Administered 2012-12-03: 4 mg via INTRAVENOUS
  Filled 2012-12-03: qty 2

## 2012-12-03 MED ORDER — SODIUM CHLORIDE 0.9 % IV SOLN
INTRAVENOUS | Status: DC
  Administered 2012-12-03 – 2012-12-04 (×2): via INTRAVENOUS

## 2012-12-03 MED ORDER — MORPHINE SULFATE 4 MG/ML IJ SOLN
4.0000 mg | INTRAMUSCULAR | Status: DC | PRN
  Filled 2012-12-03: qty 1

## 2012-12-03 MED ORDER — BUPIVACAINE HCL (PF) 0.5 % IJ SOLN
INTRAMUSCULAR | Status: AC
  Filled 2012-12-03: qty 30

## 2012-12-03 MED ORDER — BUPIVACAINE HCL (PF) 0.5 % IJ SOLN
INTRAMUSCULAR | Status: DC | PRN
  Administered 2012-12-03: 3 mL

## 2012-12-03 MED ORDER — MEPERIDINE HCL 50 MG/ML IJ SOLN: 6.2500 mg | INTRAMUSCULAR | Status: DC | PRN

## 2012-12-03 MED ORDER — CEFAZOLIN SODIUM-DEXTROSE 2-3 GM-% IV SOLR
INTRAVENOUS | Status: DC | PRN
  Administered 2012-12-03: 2 g via INTRAVENOUS

## 2012-12-03 MED ORDER — PROPOFOL INFUSION 10 MG/ML OPTIME
INTRAVENOUS | Status: DC | PRN
  Administered 2012-12-03: 100 ug/kg/min via INTRAVENOUS

## 2012-12-03 MED ORDER — EPHEDRINE SULFATE 50 MG/ML IJ SOLN
INTRAMUSCULAR | Status: DC | PRN
  Administered 2012-12-03 (×2): 10 mg via INTRAVENOUS

## 2012-12-03 MED ORDER — PROMETHAZINE HCL 25 MG/ML IJ SOLN: 6.2500 mg | INTRAMUSCULAR | Status: DC | PRN

## 2012-12-03 MED ORDER — PROPOFOL 10 MG/ML IV BOLUS
INTRAVENOUS | Status: DC | PRN
  Administered 2012-12-03: 30 mg via INTRAVENOUS
  Administered 2012-12-03: 20 mg via INTRAVENOUS

## 2012-12-03 SURGICAL SUPPLY — 44 items
BAG ZIPLOCK 12X15 (MISCELLANEOUS) IMPLANT
BANDAGE GAUZE ELAST BULKY 4 IN (GAUZE/BANDAGES/DRESSINGS) ×2 IMPLANT
BIT DRILL CANN LG 4.3MM (BIT) ×1 IMPLANT
CLOTH BEACON ORANGE TIMEOUT ST (SAFETY) ×2 IMPLANT
DRAPE STERI IOBAN 125X83 (DRAPES) ×2 IMPLANT
DRAPE U-SHAPE 47X51 STRL (DRAPES) ×2 IMPLANT
DRILL BIT CANN LG 4.3MM (BIT) ×2
DRSG EMULSION OIL 3X16 NADH (GAUZE/BANDAGES/DRESSINGS) IMPLANT
DRSG MEPILEX BORDER 4X4 (GAUZE/BANDAGES/DRESSINGS) ×6 IMPLANT
DRSG MEPILEX BORDER 4X8 (GAUZE/BANDAGES/DRESSINGS) ×4 IMPLANT
DRSG PAD ABDOMINAL 8X10 ST (GAUZE/BANDAGES/DRESSINGS) IMPLANT
DURAPREP 26ML APPLICATOR (WOUND CARE) ×2 IMPLANT
ELECT REM PT RETURN 9FT ADLT (ELECTROSURGICAL) ×2
ELECTRODE REM PT RTRN 9FT ADLT (ELECTROSURGICAL) ×1 IMPLANT
EVACUATOR 1/8 PVC DRAIN (DRAIN) IMPLANT
GLOVE BIO SURGEON STRL SZ7.5 (GLOVE) IMPLANT
GLOVE BIO SURGEON STRL SZ8 (GLOVE) ×2 IMPLANT
GLOVE BIOGEL PI IND STRL 6 (GLOVE) ×1 IMPLANT
GLOVE BIOGEL PI INDICATOR 6 (GLOVE) ×1
GLOVE SURG SS PI 6.0 STRL IVOR (GLOVE) ×2 IMPLANT
GOWN STRL NON-REIN LRG LVL3 (GOWN DISPOSABLE) ×2 IMPLANT
GOWN STRL REIN XL XLG (GOWN DISPOSABLE) ×2 IMPLANT
GUIDEPIN 3.2X17.5 THRD DISP (PIN) ×2 IMPLANT
MANIFOLD NEPTUNE II (INSTRUMENTS) IMPLANT
NAIL HIP FRACT 130D 11X180 (Screw) ×2 IMPLANT
NS IRRIG 1000ML POUR BTL (IV SOLUTION) ×2 IMPLANT
PACK GENERAL/GYN (CUSTOM PROCEDURE TRAY) ×2 IMPLANT
PAD CAST 4YDX4 CTTN HI CHSV (CAST SUPPLIES) ×1 IMPLANT
PADDING CAST COTTON 4X4 STRL (CAST SUPPLIES) ×1
POSITIONER SURGICAL ARM (MISCELLANEOUS) ×2 IMPLANT
SCREW BONE CORTICAL 5.0X36 (Screw) ×2 IMPLANT
SCREW LAG HIP NAIL 10.5X95 (Screw) ×2 IMPLANT
SPONGE GAUZE 4X4 12PLY (GAUZE/BANDAGES/DRESSINGS) IMPLANT
SPONGE LAP 18X18 X RAY DECT (DISPOSABLE) IMPLANT
STRIP CLOSURE SKIN 1/2X4 (GAUZE/BANDAGES/DRESSINGS) IMPLANT
SUT ETHILON 3 0 PS 1 (SUTURE) ×2 IMPLANT
SUT MNCRL AB 3-0 PS2 18 (SUTURE) ×2 IMPLANT
SUT VIC AB 0 CT1 36 (SUTURE) IMPLANT
SUT VIC AB 1 CT1 36 (SUTURE) ×4 IMPLANT
SUT VIC AB 2-0 CT1 27 (SUTURE) ×2
SUT VIC AB 2-0 CT1 TAPERPNT 27 (SUTURE) ×2 IMPLANT
TOWEL OR 17X26 10 PK STRL BLUE (TOWEL DISPOSABLE) ×4 IMPLANT
TRAY FOLEY CATH 14FRSI W/METER (CATHETERS) ×2 IMPLANT
WATER STERILE IRR 1500ML POUR (IV SOLUTION) ×2 IMPLANT

## 2012-12-03 NOTE — ED Notes (Signed)
Pt has left and right hearing aids, yellow colored chain with yellow colored cross attached. Pt also has a watch with brown colored band taken off and placed in pt supplied brown bag which is at bedside. Pts family is aware that this writer did take these items off the pt and placed them there. Pt remains with yellow color band to left hand.

## 2012-12-03 NOTE — ED Notes (Signed)
Patient transported to X-ray 

## 2012-12-03 NOTE — Consult Note (Signed)
Reason for Consult:  Left hip pain Referring Physician:  Dr. Hollice Espy is an 79 y.o. male.  HPI: 79 y/o male with PMH of CAD fell earlier today at work when he caught his foot in a phone cable.  No hip pain prior to this injury.  No h/o fragility fracture.  He is not a smoker and is not diabetic.  He has been unable to bear weight on the left lower extremity since the injury.  He c/o aching pain that is mild at rest and becomes more severe and sharp when trying to extend the hip fully.  Past Medical History  Diagnosis Date  . CORONARY ARTERY DISEASE 12/26/2006  . HYPERLIPIDEMIA 12/26/2006  . HYPERTENSION 12/26/2006  . PERIPHERAL VASCULAR DISEASE 12/26/2006    Past Surgical History  Procedure Laterality Date  . Abdominal aortic aneurysm repair    . Transurethral resection of prostate    . Hernia repair      ingunial  . Cardiac catheterization      Family History  Problem Relation Age of Onset  . Diabetes Mother   . Hypertension Mother     Social History:  reports that he quit smoking about 34 years ago. His smoking use included Cigarettes. He smoked 0.50 packs per day. He has never used smokeless tobacco. He reports that he drinks about 0.6 ounces of alcohol per week. He reports that he does not use illicit drugs.  Allergies:  Allergies  Allergen Reactions  . Cardura (Doxazosin Mesylate) Other (See Comments)    Upset stomach    Medications: I have reviewed the patient's current medications.  Results for orders placed during the hospital encounter of 12/03/12 (from the past 48 hour(s))  BASIC METABOLIC PANEL     Status: Abnormal   Collection Time    12/03/12  6:25 PM      Result Value Range   Sodium 140  135 - 145 mEq/L   Potassium 3.2 (*) 3.5 - 5.1 mEq/L   Chloride 102  96 - 112 mEq/L   CO2 28  19 - 32 mEq/L   Glucose, Bld 119 (*) 70 - 99 mg/dL   BUN 22  6 - 23 mg/dL   Creatinine, Ser 1.61  0.50 - 1.35 mg/dL   Calcium 9.2  8.4 - 09.6 mg/dL   GFR calc non Af  Amer 66 (*) >90 mL/min   GFR calc Af Amer 76 (*) >90 mL/min   Comment:            The eGFR has been calculated     using the CKD EPI equation.     This calculation has not been     validated in all clinical     situations.     eGFR's persistently     <90 mL/min signify     possible Chronic Kidney Disease.  CBC WITH DIFFERENTIAL     Status: Abnormal   Collection Time    12/03/12  6:25 PM      Result Value Range   WBC 13.4 (*) 4.0 - 10.5 K/uL   RBC 5.07  4.22 - 5.81 MIL/uL   Hemoglobin 15.5  13.0 - 17.0 g/dL   HCT 04.5  40.9 - 81.1 %   MCV 87.2  78.0 - 100.0 fL   MCH 30.6  26.0 - 34.0 pg   MCHC 35.1  30.0 - 36.0 g/dL   RDW 91.4  78.2 - 95.6 %   Platelets 111 (*) 150 -  400 K/uL   Comment: REPEATED TO VERIFY     SPECIMEN CHECKED FOR CLOTS     PLATELET COUNT CONFIRMED BY SMEAR   Neutrophils Relative % 86 (*) 43 - 77 %   Neutro Abs 11.5 (*) 1.7 - 7.7 K/uL   Lymphocytes Relative 7 (*) 12 - 46 %   Lymphs Abs 1.0  0.7 - 4.0 K/uL   Monocytes Relative 7  3 - 12 %   Monocytes Absolute 0.9  0.1 - 1.0 K/uL   Eosinophils Relative 0  0 - 5 %   Eosinophils Absolute 0.0  0.0 - 0.7 K/uL   Basophils Relative 0  0 - 1 %   Basophils Absolute 0.0  0.0 - 0.1 K/uL  PROTIME-INR     Status: None   Collection Time    12/03/12  6:25 PM      Result Value Range   Prothrombin Time 14.0  11.6 - 15.2 seconds   INR 1.09  0.00 - 1.49  TYPE AND SCREEN     Status: None   Collection Time    12/03/12  6:25 PM      Result Value Range   ABO/RH(D) O POS     Antibody Screen NEG     Sample Expiration 12/06/2012    ABO/RH     Status: None   Collection Time    12/03/12  6:25 PM      Result Value Range   ABO/RH(D) O POS      Dg Hip Complete Left  12/03/2012   *RADIOLOGY REPORT*  Clinical Data: Fall with left hip pain.  LEFT HIP - COMPLETE 2+ VIEW  Comparison: None.  Findings: No displaced fractures are identified.  There is subtle lucency and cortical irregularity along the superior aspect of the greater  trochanter which could potentially represent a nondisplaced incomplete fracture.  Mild osteoarthritis present in the left hip. Soft tissues are unremarkable.  IMPRESSION: Possible subtle nondisplaced fracture through the greater trochanter.   Original Report Authenticated By: Irish Lack, M.D.   Ct Hip Left Wo Contrast  12/03/2012   *RADIOLOGY REPORT*  Clinical Data: Fall, left hip pain.  CT OF THE LEFT HIP WITHOUT CONTRAST  Technique:  Multidetector CT imaging was performed according to the standard protocol. Multiplanar CT image reconstructions were also generated.  Comparison: Left hip plain films performed earlier today.  Findings: There is a fracture involving the left femoral greater trochanter.  There is subtle extension across the femoral neck, best seen on the sagittal view fracture is nondisplaced.  No dislocation or subluxation no additional acute bony abnormality.  IMPRESSION: Left femoral intertrochanteric fracture.  There is a subtle nondisplaced component extending from the greater trochanter across the femoral neck, best seen on the sagittal views.   Original Report Authenticated By: Charlett Nose, M.D.   Dg Knee Complete 4 Views Left  12/03/2012   *RADIOLOGY REPORT*  Clinical Data: Knee pain status post fall today.  LEFT KNEE - COMPLETE 4+ VIEW  Comparison: None.  Findings: Positioning is limited by the patient's inability to fully extend the knee.  The bones are demineralized. Tricompartmental degenerative changes are present.  There is no evidence of acute fracture, dislocation or large joint effusion. Diffuse vascular calcifications are noted.  IMPRESSION: Tricompartmental degenerative changes at the left knee.  No acute osseous findings demonstrated.   Original Report Authenticated By: Carey Bullocks, M.D.    ROS:  No recent f/c/n/v/wt loss. PE:  Blood pressure 126/64, pulse 62, temperature 98  F (36.7 C), temperature source Oral, resp. rate 18, SpO2 90.00%. wn wd male in nad.  A  and O.  Mood and affect normal.  EOMI.  Resp unlabored.  Lying comfortably on his back with left hip flexed.  Pain with attempted motion at L hip.  Skin healthy and intact.  1+ dp and pt pulses.  Feels LT normally throughout L LE.  5/5 strength in PF and DF at the ankle.  No lymphadenopathy about the L LE.  Assessment/Plan: L hip intertroch fracture - to OR of IM nail.  The risks and benefits of the alternative treatment options have been discussed in detail.  The patient wishes to proceed with surgery and specifically understands risks of bleeding, infection, nerve damage, blood clots, need for additional surgery, amputation and death.   [name] December 27, 2012, 9:15 PM

## 2012-12-03 NOTE — Anesthesia Procedure Notes (Signed)
Spinal Patient location during procedure: OR Staffing Anesthesiologist: Audley Hinojos Performed by: anesthesiologist  Preanesthetic Checklist Completed: patient identified, site marked, surgical consent, pre-op evaluation, timeout performed, IV checked, risks and benefits discussed and monitors and equipment checked Spinal Block Patient position: right lateral decubitus Prep: Betadine Patient monitoring: heart rate, continuous pulse ox and blood pressure Approach: left paramedian Location: L2-3 Injection technique: single-shot Needle Needle type: Spinocan  Needle gauge: 22 G Needle length: 9 cm Additional Notes Expiration date of kit checked and confirmed. Patient tolerated procedure well, without complications.     

## 2012-12-03 NOTE — ED Provider Notes (Signed)
History     CSN: [id number]  Arrival date & time 12/03/12  1517   First MD Initiated Contact with Patient 12/03/12 1538      Chief Complaint  Patient presents with  . Fall    (Consider location/radiation/quality/duration/timing/severity/associated sxs/prior treatment) HPI [name] is a 79 y.o. male who presents to ED with complaint of a fall. Pt was in his office, states was walking when he tripped over a telephone cord. States fell onto left side. Reports pain in left hip and left thigh. States unable to walk. Pain with movement. Denies numbness or weakness distally. Abrasion to left knee. Did not hit his head. No LOC. No neck or back pain. Denies prior hip or back problems.   Past Medical History  Diagnosis Date  . CORONARY ARTERY DISEASE 12/26/2006  . HYPERLIPIDEMIA 12/26/2006  . HYPERTENSION 12/26/2006  . PERIPHERAL VASCULAR DISEASE 12/26/2006    Past Surgical History  Procedure Laterality Date  . Abdominal aortic aneurysm repair    . Transurethral resection of prostate    . Hernia repair      ingunial  . Cardiac catheterization      Family History  Problem Relation Age of Onset  . Diabetes Mother   . Hypertension Mother     History  Substance Use Topics  . Smoking status: Former Smoker    Quit date: 06/20/1978  . Smokeless tobacco: Never Used  . Alcohol Use: Yes     Comment: rarely      Review of Systems  Musculoskeletal: Positive for arthralgias.  Skin: Positive for wound.  All other systems reviewed and are negative.    Allergies  Review of patient's allergies indicates no known allergies.  Home Medications   Current Outpatient Rx  Name  Route  Sig  Dispense  Refill  . aspirin 81 MG tablet   Oral   Take 81 mg by mouth daily.           Marland Kitchen doxazosin (CARDURA) 4 MG tablet   Oral   Take 1 tablet (4 mg total) by mouth at bedtime.   90 tablet   6   . doxazosin (CARDURA) 4 MG tablet   Oral   Take 1 tablet (4 mg total) by mouth at  bedtime.   90 tablet   4   . hydrochlorothiazide (HYDRODIURIL) 25 MG tablet   Oral   Take 0.5 tablets (12.5 mg total) by mouth daily.   90 tablet   6   . metoprolol (LOPRESSOR) 100 MG tablet   Oral   Take 0.5 tablets (50 mg total) by mouth 2 (two) times daily.   90 tablet   6   . simvastatin (ZOCOR) 20 MG tablet   Oral   Take 1 tablet (20 mg total) by mouth at bedtime.   90 tablet   6   . EXPIRED: temazepam (RESTORIL) 15 MG capsule   Oral   Take 1 capsule (15 mg total) by mouth at bedtime as needed for sleep.   90 capsule   1   . temazepam (RESTORIL) 15 MG capsule   Oral   Take 1 capsule (15 mg total) by mouth at bedtime as needed.   90 capsule   1     BP 146/75  Pulse 63  Temp(Src) 98 F (36.7 C) (Oral)  Resp 18  SpO2 92%  Physical Exam  Nursing note and vitals reviewed. Constitutional: He is oriented to person, place, and time. He appears well-developed and well-nourished.  No distress.  HENT:  Head: Normocephalic and atraumatic.  Eyes: Conjunctivae are normal. Pupils are equal, round, and reactive to light.  Neck: Normal range of motion. Neck supple.  Cardiovascular: Normal rate, regular rhythm and normal heart sounds.   Pulmonary/Chest: Effort normal. No respiratory distress. He has no wheezes. He has no rales.  Abdominal: Soft. Bowel sounds are normal. He exhibits no distension. There is no tenderness. There is no rebound.  Musculoskeletal: He exhibits no edema.  Abrasion to left knee. No tenderness to palpation over left hip, pelvis, thigh, or knee. Pain with hip flexion, internal and external rotation. Pain with knee flexion and extension. Joint stable with negative anterior and posterior drawer signs. No laxity or pain with medial or lateral stress. Dorsal pedal pulses normal and equal bilaterally.     Neurological: He is alert and oriented to person, place, and time. No cranial nerve deficit.  Skin: Skin is warm and dry.    ED Course  Procedures  (including critical care time)   Date: 12/03/2012  Rate: 66  Rhythm: normal sinus rhythm  QRS Axis: normal  Intervals: normal  ST/T Wave abnormalities: normal  Conduction Disutrbances: none  Narrative Interpretation:   Old EKG Reviewed: No significant changes noted    Results for orders placed during the hospital encounter of 12/03/12  BASIC METABOLIC PANEL      Result Value Range   Sodium 140  135 - 145 mEq/L   Potassium 3.2 (*) 3.5 - 5.1 mEq/L   Chloride 102  96 - 112 mEq/L   CO2 28  19 - 32 mEq/L   Glucose, Bld 119 (*) 70 - 99 mg/dL   BUN 22  6 - 23 mg/dL   Creatinine, Ser 1.19  0.50 - 1.35 mg/dL   Calcium 9.2  8.4 - 14.7 mg/dL   GFR calc non Af Amer 66 (*) >90 mL/min   GFR calc Af Amer 76 (*) >90 mL/min  CBC WITH DIFFERENTIAL      Result Value Range   WBC 13.4 (*) 4.0 - 10.5 K/uL   RBC 5.07  4.22 - 5.81 MIL/uL   Hemoglobin 15.5  13.0 - 17.0 g/dL   HCT 82.9  56.2 - 13.0 %   MCV 87.2  78.0 - 100.0 fL   MCH 30.6  26.0 - 34.0 pg   MCHC 35.1  30.0 - 36.0 g/dL   RDW 86.5  78.4 - 69.6 %   Platelets 111 (*) 150 - 400 K/uL   Neutrophils Relative % 86 (*) 43 - 77 %   Neutro Abs 11.5 (*) 1.7 - 7.7 K/uL   Lymphocytes Relative 7 (*) 12 - 46 %   Lymphs Abs 1.0  0.7 - 4.0 K/uL   Monocytes Relative 7  3 - 12 %   Monocytes Absolute 0.9  0.1 - 1.0 K/uL   Eosinophils Relative 0  0 - 5 %   Eosinophils Absolute 0.0  0.0 - 0.7 K/uL   Basophils Relative 0  0 - 1 %   Basophils Absolute 0.0  0.0 - 0.1 K/uL  PROTIME-INR      Result Value Range   Prothrombin Time 14.0  11.6 - 15.2 seconds   INR 1.09  0.00 - 1.49  TYPE AND SCREEN      Result Value Range   ABO/RH(D) O POS     Antibody Screen NEG     Sample Expiration 12/06/2012    ABO/RH      Result Value Range  ABO/RH(D) O POS     Dg Hip Complete Left  12/03/2012   *RADIOLOGY REPORT*  Clinical Data: Fall with left hip pain.  LEFT HIP - COMPLETE 2+ VIEW  Comparison: None.  Findings: No displaced fractures are identified.  There  is subtle lucency and cortical irregularity along the superior aspect of the greater trochanter which could potentially represent a nondisplaced incomplete fracture.  Mild osteoarthritis present in the left hip. Soft tissues are unremarkable.  IMPRESSION: Possible subtle nondisplaced fracture through the greater trochanter.   Original Report Authenticated By: Irish Lack, M.D.   Ct Hip Left Wo Contrast  12/03/2012   *RADIOLOGY REPORT*  Clinical Data: Fall, left hip pain.  CT OF THE LEFT HIP WITHOUT CONTRAST  Technique:  Multidetector CT imaging was performed according to the standard protocol. Multiplanar CT image reconstructions were also generated.  Comparison: Left hip plain films performed earlier today.  Findings: There is a fracture involving the left femoral greater trochanter.  There is subtle extension across the femoral neck, best seen on the sagittal view fracture is nondisplaced.  No dislocation or subluxation no additional acute bony abnormality.  IMPRESSION: Left femoral intertrochanteric fracture.  There is a subtle nondisplaced component extending from the greater trochanter across the femoral neck, best seen on the sagittal views.   Original Report Authenticated By: Charlett Nose, M.D.   Dg Knee Complete 4 Views Left  12/03/2012   *RADIOLOGY REPORT*  Clinical Data: Knee pain status post fall today.  LEFT KNEE - COMPLETE 4+ VIEW  Comparison: None.  Findings: Positioning is limited by the patient's inability to fully extend the knee.  The bones are demineralized. Tricompartmental degenerative changes are present.  There is no evidence of acute fracture, dislocation or large joint effusion. Diffuse vascular calcifications are noted.  IMPRESSION: Tricompartmental degenerative changes at the left knee.  No acute osseous findings demonstrated.   Original Report Authenticated By: Carey Bullocks, M.D.     1. Hip fracture, left, closed, initial encounter   2. Other and unspecified hyperlipidemia    3. Peripheral vascular disease, unspecified   4. Unspecified essential hypertension       MDM  Pt with intra trochanteric left hip fracture. Fall mechanical. Discussed with Dr. Victorino Dike orthopedics, will take to OR today. Spoke with [facility], will admit. Pre op labs ordered. Results and plan discussed with pt.   Filed Vitals:   12/03/12 2345 12/04/12 0000 12/04/12 0015 12/04/12 0028  BP: 100/60  102/59 102/70  Pulse: 78 72  89  Temp: 98.6 F (37 C)  98.6 F (37 C) 98.4 F (36.9 C)  TempSrc:      Resp: 12 10  14   SpO2: 100% 99%  93%   Medications  morphine 4 MG/ML injection 4 mg ( Intravenous MAR Unhold 12/04/12 0041)  0.9 %  sodium chloride infusion ( Intravenous New Bag/Given 12/03/12 2319)  fentaNYL (SUBLIMAZE) injection 50 mcg (50 mcg Intramuscular Given 12/03/12 1556)  ondansetron (ZOFRAN) injection 4 mg (4 mg Intravenous Given 12/03/12 1831)           Myriam Jacobson Ventura Hollenbeck, PA-C 12/04/12 0103  Lemont Fillers A Sharonda Llamas, PA-C 12/04/12 0104

## 2012-12-03 NOTE — Brief Op Note (Signed)
12/03/2012  11:01 PM  PATIENT:  [name]  79 y.o. male  PRE-OPERATIVE DIAGNOSIS:  Left hip intertrochantric fracture  POST-OPERATIVE DIAGNOSIS:  left hip intertrochanteric fracture  Procedure(s): Intramedullary nailing of left hip intertrochanteric fracture  SURGEON:  Toni Arthurs, MD  ASSISTANT: n/a  ANESTHESIA:   spinal  EBL:  50 cc  TOURNIQUET:  n/a  COMPLICATIONS:  None apparent  DISPOSITION:  Extubated, awake and stable to recovery.  DICTATION ID:  [id number]

## 2012-12-03 NOTE — H&P (Signed)
[facility] History and Physical  DRAYSON DORKO ZOX:[id number] DOB: 14-Jan-1929 DOA: 12/03/2012  Referring physician: Lemont Fillers PCP: Rogelia Boga, MD  Specialists: ORtho  Chief Complaint: fall with hip pain  HPI: [name] is a 79 y.o. male  With past medical history of a aortic abdominal aneurysm repair in 2006, comes in for follow in the office. He relates he went to take some papers to a colleague when he got tangled up in the telephone cables and fell on his left side. He relates since then his been having left-sided pain with an unstable ambulation and pain to movement. He denies any numbness or tingling in his lower extremity. He can walk more than 2 miles without getting short of breath, he can walk up a flight of stairs on a daily basis without any symptoms. He plays golf twice a week and walks the whole course without any difficulties.  Review of Systems: The patient denies anorexia, fever, weight loss,, vision loss, decreased hearing, hoarseness, chest pain, syncope, dyspnea on exertion, peripheral edema, balance deficits, hemoptysis, abdominal pain, melena, hematochezia, severe indigestion/heartburn, hematuria, incontinence, genital sores, muscle weakness, suspicious skin lesions, transient blindness,  depression, unusual weight change, abnormal bleeding, enlarged lymph nodes, angioedema, and breast masses.    Past Medical History  Diagnosis Date  . CORONARY ARTERY DISEASE 12/26/2006  . HYPERLIPIDEMIA 12/26/2006  . HYPERTENSION 12/26/2006  . PERIPHERAL VASCULAR DISEASE 12/26/2006   Past Surgical History  Procedure Laterality Date  . Abdominal aortic aneurysm repair    . Transurethral resection of prostate    . Hernia repair      ingunial  . Cardiac catheterization     Social History:  reports that he quit smoking about 34 years ago. His smoking use included Cigarettes. He smoked 0.50 packs per day. He has never used smokeless tobacco. He reports that he drinks  about 0.6 ounces of alcohol per week. He reports that he does not use illicit drugs.   Allergies  Allergen Reactions  . Cardura (Doxazosin Mesylate) Other (See Comments)    Upset stomach    Family History  Problem Relation Age of Onset  . Diabetes Mother   . Hypertension Mother     Prior to Admission medications   Medication Sig Start Date End Date Taking? Authorizing Provider  aspirin 81 MG tablet Take 81 mg by mouth every evening.    Yes Historical Provider, MD  doxazosin (CARDURA) 4 MG tablet Take 4 mg by mouth every morning.   Yes Historical Provider, MD  hydrochlorothiazide (HYDRODIURIL) 25 MG tablet Take 0.5 tablets (12.5 mg total) by mouth daily. 06/12/12  Yes Gordy Savers, MD  Multiple Vitamin (MULTIVITAMIN WITH MINERALS) TABS Take 1 tablet by mouth every morning.   Yes Historical Provider, MD  simvastatin (ZOCOR) 20 MG tablet Take 1 tablet (20 mg total) by mouth at bedtime. 06/12/12  Yes Gordy Savers, MD  temazepam (RESTORIL) 15 MG capsule Take 1 capsule (15 mg total) by mouth at bedtime as needed. 07/10/12  Yes Gordy Savers, MD   Physical Exam: Filed Vitals:   12/03/12 1530 12/03/12 1531 12/03/12 1808  BP:  146/75 126/64  Pulse:  63 62  Temp:  98 F (36.7 C)   TempSrc:  Oral   Resp:  18 18  SpO2: 95% 92% 90%    BP 126/64  Pulse 62  Temp(Src) 98 F (36.7 C) (Oral)  Resp 18  SpO2 90%  General Appearance:    Alert, cooperative, no  distress, appears stated age  Head:    Normocephalic, without obvious abnormality, atraumatic           Throat:   Lips, mucosa, and tongue normal; teeth and gums normal  Neck:   Supple, symmetrical, trachea midline, no adenopathy;       thyroid:  No enlargement/tenderness/nodules; no carotid   bruit or JVD  Back:     Symmetric, no curvature, ROM normal, no CVA tenderness  Lungs:     Clear to auscultation bilaterally, respirations unlabored  Chest wall:    No tenderness or deformity  Heart:    Regular rate and  rhythm, S1 and S2 normal, no murmur, rub   or gallop  Abdomen:     Soft, non-tender, bowel sounds active all four quadrants,    no masses, no organomegaly        Extremities:   cannot externally rotate the hip. And painful with movement.   Pulses:   2+ and symmetric all extremities  Skin:   Skin color, texture, turgor normal, no rashes or lesions  Lymph nodes:   Cervical, supraclavicular, and axillary nodes normal  Neurologic:   CNII-XII intact. Normal strength, sensation and reflexes      throughout    Labs on Admission:  Basic Metabolic Panel:  Recent Labs Lab 12/03/12 1825  NA 140  K 3.2*  CL 102  CO2 28  GLUCOSE 119*  BUN 22  CREATININE 1.03  CALCIUM 9.2   Liver Function Tests: No results found for this basename: AST, ALT, ALKPHOS, BILITOT, PROT, ALBUMIN,  in the last 168 hours No results found for this basename: LIPASE, AMYLASE,  in the last 168 hours No results found for this basename: AMMONIA,  in the last 168 hours CBC:  Recent Labs Lab 12/03/12 1825  WBC 13.4*  NEUTROABS 11.5*  HGB 15.5  HCT 44.2  MCV 87.2  PLT 111*   Cardiac Enzymes: No results found for this basename: CKTOTAL, CKMB, CKMBINDEX, TROPONINI,  in the last 168 hours  BNP (last 3 results) No results found for this basename: PROBNP,  in the last 8760 hours CBG: No results found for this basename: GLUCAP,  in the last 168 hours  Radiological Exams on Admission: Dg Hip Complete Left  12/03/2012   *RADIOLOGY REPORT*  Clinical Data: Fall with left hip pain.  LEFT HIP - COMPLETE 2+ VIEW  Comparison: None.  Findings: No displaced fractures are identified.  There is subtle lucency and cortical irregularity along the superior aspect of the greater trochanter which could potentially represent a nondisplaced incomplete fracture.  Mild osteoarthritis present in the left hip. Soft tissues are unremarkable.  IMPRESSION: Possible subtle nondisplaced fracture through the greater trochanter.   Original Report  Authenticated By: Irish Lack, M.D.   Ct Hip Left Wo Contrast  12/03/2012   *RADIOLOGY REPORT*  Clinical Data: Fall, left hip pain.  CT OF THE LEFT HIP WITHOUT CONTRAST  Technique:  Multidetector CT imaging was performed according to the standard protocol. Multiplanar CT image reconstructions were also generated.  Comparison: Left hip plain films performed earlier today.  Findings: There is a fracture involving the left femoral greater trochanter.  There is subtle extension across the femoral neck, best seen on the sagittal view fracture is nondisplaced.  No dislocation or subluxation no additional acute bony abnormality.  IMPRESSION: Left femoral intertrochanteric fracture.  There is a subtle nondisplaced component extending from the greater trochanter across the femoral neck, best seen on the sagittal views.  Original Report Authenticated By: Charlett Nose, M.D.   Dg Knee Complete 4 Views Left  12/03/2012   *RADIOLOGY REPORT*  Clinical Data: Knee pain status post fall today.  LEFT KNEE - COMPLETE 4+ VIEW  Comparison: None.  Findings: Positioning is limited by the patient's inability to fully extend the knee.  The bones are demineralized. Tricompartmental degenerative changes are present.  There is no evidence of acute fracture, dislocation or large joint effusion. Diffuse vascular calcifications are noted.  IMPRESSION: Tricompartmental degenerative changes at the left knee.  No acute osseous findings demonstrated.   Original Report Authenticated By: Carey Bullocks, M.D.    EKG: Independently reviewed. Sinus rhythm normal axis no T wave abnormalities the  Assessment/Plan  Intertrochanteric fracture of left femur/preop evaluation: - Have discussed with patient the risk and benefits of having this surgery. He has less than one percent of having any cardiopulmonary complications, he can walk up a flight of stairs without any difficulties. He could walk on the golf course without having any symptoms. He  is not anemic his renal function is stable. I explained to him the risk and benefits and he has agreed to proceed with surgery the  - I have discussed and answered complications of surgery of but not only limited to bleeding, pain, infection anesthesia complications and postoperative complications as aspiration and confusion early sedation was and acute confusional state.   HYPERLIPIDEMIA: - Continue statins. Check a fasting lipid panel and LFTs.   HYPERTENSION: - Resume home medications in the morning.  Hypokalemia: Replace.  Leukocytosis: - Reactive.  Code Status: full Family Communication: son in law Disposition Plan: inpatein 3 days  Time spent: 70 minutes  Marinda Elk [facility] Pager [phone]  If 7PM-7AM, please contact night-coverage [email or web address] Password [facility] 12/03/2012, 8:57 PM

## 2012-12-03 NOTE — Transfer of Care (Signed)
Immediate Anesthesia Transfer of Care Note  Patient: [name]  Procedure(s) Performed: Procedure(s): INTRAMEDULLARY (IM) NAIL INTERTROCHANTRIC (Left)  Patient Location: PACU  Anesthesia Type:Regional and Spinal  Level of Consciousness: awake, sedated and patient cooperative  Airway & Oxygen Therapy: Patient Spontanous Breathing and Patient connected to face mask oxygen  Post-op Assessment: Report given to PACU RN and Post -op Vital signs reviewed and stable  Post vital signs: Reviewed and stable  Complications: No apparent anesthesia complications

## 2012-12-03 NOTE — Anesthesia Preprocedure Evaluation (Addendum)
Anesthesia Evaluation  Patient identified by MRN, date of birth, ID band Patient awake    Reviewed: Allergy & Precautions, H&P , NPO status , Patient's Chart, lab work & pertinent test results  Airway Mallampati: III TM Distance: >3 FB Neck ROM: Full    Dental no notable dental hx.    Pulmonary neg pulmonary ROS, former smoker,  breath sounds clear to auscultation  Pulmonary exam normal       Cardiovascular hypertension, Pt. on medications - angina+ CAD and + Peripheral Vascular Disease (AAA repair 2005) Rhythm:Regular Rate:Normal     Neuro/Psych negative neurological ROS  negative psych ROS   GI/Hepatic negative GI ROS, Neg liver ROS,   Endo/Other  negative endocrine ROS  Renal/GU negative Renal ROS  negative genitourinary   Musculoskeletal negative musculoskeletal ROS (+)   Abdominal   Peds negative pediatric ROS (+)  Hematology negative hematology ROS (+)   Anesthesia Other Findings Cap front upper  Reproductive/Obstetrics negative OB ROS                           Anesthesia Physical Anesthesia Plan  ASA: III  Anesthesia Plan: Spinal   Post-op Pain Management:    Induction: Intravenous  Airway Management Planned: Simple Face Mask  Additional Equipment:   Intra-op Plan:   Post-operative Plan:   Informed Consent: I have reviewed the patients History and Physical, chart, labs and discussed the procedure including the risks, benefits and alternatives for the proposed anesthesia with the patient or authorized representative who has indicated his/her understanding and acceptance.   Dental advisory given  Plan Discussed with: CRNA  Anesthesia Plan Comments:         Anesthesia Quick Evaluation

## 2012-12-03 NOTE — ED Notes (Signed)
PER EMS- pt picked up from work with c/o fall.  Reports he tripped over a phone cord.  Landed on l knee and leg onto carpet.  Pt c/o l thigh pain.  Reports 0/10 pain with no movement 5/10 pain with movement.  Pt is alert and oriented.  No deformities or shortening or rotation noted.  Denies head injury, neck or back pain. NO LOC.

## 2012-12-04 ENCOUNTER — Encounter (HOSPITAL_COMMUNITY): Payer: Self-pay | Admitting: Orthopedic Surgery

## 2012-12-04 DIAGNOSIS — S72143A Displaced intertrochanteric fracture of unspecified femur, initial encounter for closed fracture: Secondary | ICD-10-CM

## 2012-12-04 LAB — PROTIME-INR: INR: 1.14 (ref 0.00–1.49)

## 2012-12-04 LAB — COMPREHENSIVE METABOLIC PANEL
ALT: 18 U/L (ref 0–53)
AST: 21 U/L (ref 0–37)
Albumin: 3.2 g/dL — ABNORMAL LOW (ref 3.5–5.2)
CO2: 28 mEq/L (ref 19–32)
Calcium: 8.6 mg/dL (ref 8.4–10.5)
Creatinine, Ser: 1.05 mg/dL (ref 0.50–1.35)
Sodium: 138 mEq/L (ref 135–145)
Total Protein: 5.5 g/dL — ABNORMAL LOW (ref 6.0–8.3)

## 2012-12-04 LAB — CBC
MCH: 29.6 pg (ref 26.0–34.0)
Platelets: 93 10*3/uL — ABNORMAL LOW (ref 150–400)
RBC: 4.69 MIL/uL (ref 4.22–5.81)
RDW: 13.2 % (ref 11.5–15.5)

## 2012-12-04 MED ORDER — SODIUM CHLORIDE 0.9 % IJ SOLN: 3.0000 mL | INTRAMUSCULAR | Status: DC | PRN

## 2012-12-04 MED ORDER — DOXAZOSIN MESYLATE 4 MG PO TABS
4.0000 mg | ORAL_TABLET | Freq: Every morning | ORAL | Status: DC
  Administered 2012-12-05: 4 mg via ORAL

## 2012-12-04 MED ORDER — SENNA 8.6 MG PO TABS
2.0000 | ORAL_TABLET | Freq: Two times a day (BID) | ORAL | Status: DC
  Administered 2012-12-04 – 2012-12-05 (×3): 17.2 mg via ORAL
  Filled 2012-12-04: qty 2
  Filled 2012-12-04: qty 1
  Filled 2012-12-04: qty 2

## 2012-12-04 MED ORDER — HYDROCODONE-ACETAMINOPHEN 5-325 MG PO TABS: 1.0000 | ORAL_TABLET | Freq: Four times a day (QID) | ORAL | Status: DC | PRN

## 2012-12-04 MED ORDER — MORPHINE SULFATE 2 MG/ML IJ SOLN
0.5000 mg | INTRAMUSCULAR | Status: DC | PRN
  Administered 2012-12-04: 0.5 mg via INTRAVENOUS
  Filled 2012-12-04: qty 1

## 2012-12-04 MED ORDER — ASPIRIN EC 325 MG PO TBEC
325.0000 mg | DELAYED_RELEASE_TABLET | Freq: Every day | ORAL | Status: DC
  Administered 2012-12-04 – 2012-12-06 (×3): 325 mg via ORAL
  Filled 2012-12-04 (×4): qty 1

## 2012-12-04 MED ORDER — MENTHOL 3 MG MT LOZG
1.0000 | LOZENGE | OROMUCOSAL | Status: DC | PRN
  Filled 2012-12-04: qty 9

## 2012-12-04 MED ORDER — MORPHINE SULFATE 4 MG/ML IJ SOLN: 4.0000 mg | INTRAMUSCULAR | Status: DC | PRN

## 2012-12-04 MED ORDER — SIMVASTATIN 20 MG PO TABS
20.0000 mg | ORAL_TABLET | Freq: Every day | ORAL | Status: DC
  Administered 2012-12-04 – 2012-12-05 (×2): 20 mg via ORAL
  Filled 2012-12-04 (×4): qty 1

## 2012-12-04 MED ORDER — TEMAZEPAM 15 MG PO CAPS
15.0000 mg | ORAL_CAPSULE | Freq: Every evening | ORAL | Status: DC | PRN
  Administered 2012-12-04 – 2012-12-05 (×2): 15 mg via ORAL
  Filled 2012-12-04 (×2): qty 1

## 2012-12-04 MED ORDER — HEPARIN SODIUM (PORCINE) 5000 UNIT/ML IJ SOLN
5000.0000 [IU] | Freq: Three times a day (TID) | INTRAMUSCULAR | Status: DC
  Administered 2012-12-04 – 2012-12-05 (×4): 5000 [IU] via SUBCUTANEOUS
  Filled 2012-12-04 (×7): qty 1

## 2012-12-04 MED ORDER — ASPIRIN 325 MG PO TBEC: 325.0000 mg | DELAYED_RELEASE_TABLET | Freq: Every day | ORAL | Status: DC

## 2012-12-04 MED ORDER — OXYCODONE HCL 5 MG PO TABS: 5.0000 mg | ORAL_TABLET | ORAL | Status: DC | PRN

## 2012-12-04 MED ORDER — HYDROCODONE-ACETAMINOPHEN 5-325 MG PO TABS
1.0000 | ORAL_TABLET | Freq: Four times a day (QID) | ORAL | Status: DC | PRN
  Administered 2012-12-04 – 2012-12-05 (×3): 1 via ORAL
  Filled 2012-12-04 (×3): qty 1

## 2012-12-04 MED ORDER — ONDANSETRON HCL 4 MG/2ML IJ SOLN: 4.0000 mg | Freq: Four times a day (QID) | INTRAMUSCULAR | Status: DC | PRN

## 2012-12-04 MED ORDER — ONDANSETRON HCL 4 MG PO TABS: 4.0000 mg | ORAL_TABLET | Freq: Four times a day (QID) | ORAL | Status: DC | PRN

## 2012-12-04 MED ORDER — SODIUM CHLORIDE 0.9 % IV SOLN: 250.0000 mL | INTRAVENOUS | Status: DC | PRN

## 2012-12-04 MED ORDER — ACETAMINOPHEN 650 MG RE SUPP: 650.0000 mg | Freq: Four times a day (QID) | RECTAL | Status: DC | PRN

## 2012-12-04 MED ORDER — ONDANSETRON HCL 4 MG/2ML IJ SOLN: 4.0000 mg | Freq: Three times a day (TID) | INTRAMUSCULAR | Status: AC | PRN

## 2012-12-04 MED ORDER — SODIUM CHLORIDE 0.9 % IJ SOLN
3.0000 mL | Freq: Two times a day (BID) | INTRAMUSCULAR | Status: DC
  Administered 2012-12-04 – 2012-12-05 (×3): 3 mL via INTRAVENOUS

## 2012-12-04 MED ORDER — PHENOL 1.4 % MT LIQD
1.0000 | OROMUCOSAL | Status: DC | PRN
  Filled 2012-12-04: qty 177

## 2012-12-04 MED ORDER — DOCUSATE SODIUM 100 MG PO CAPS
100.0000 mg | ORAL_CAPSULE | Freq: Two times a day (BID) | ORAL | Status: DC
  Administered 2012-12-04 – 2012-12-05 (×3): 100 mg via ORAL

## 2012-12-04 MED ORDER — DOXAZOSIN MESYLATE 4 MG PO TABS
4.0000 mg | ORAL_TABLET | Freq: Every morning | ORAL | Status: DC
  Administered 2012-12-04: 4 mg via ORAL
  Filled 2012-12-04: qty 1

## 2012-12-04 MED ORDER — HYDROCHLOROTHIAZIDE 12.5 MG PO CAPS
12.5000 mg | ORAL_CAPSULE | Freq: Every day | ORAL | Status: DC
  Administered 2012-12-04 – 2012-12-06 (×3): 12.5 mg via ORAL
  Filled 2012-12-04 (×3): qty 1

## 2012-12-04 MED ORDER — SODIUM CHLORIDE 0.9 % IV SOLN: INTRAVENOUS | Status: AC

## 2012-12-04 MED ORDER — ASPIRIN 81 MG PO TABS: 81.0000 mg | ORAL_TABLET | Freq: Every evening | ORAL | Status: DC

## 2012-12-04 MED ORDER — ACETAMINOPHEN 325 MG PO TABS: 650.0000 mg | ORAL_TABLET | Freq: Four times a day (QID) | ORAL | Status: DC | PRN

## 2012-12-04 MED ORDER — HALOPERIDOL LACTATE 5 MG/ML IJ SOLN: 1.0000 mg | Freq: Four times a day (QID) | INTRAMUSCULAR | Status: DC | PRN

## 2012-12-04 MED ORDER — POTASSIUM CHLORIDE CRYS ER 20 MEQ PO TBCR
40.0000 meq | EXTENDED_RELEASE_TABLET | Freq: Two times a day (BID) | ORAL | Status: AC
  Administered 2012-12-04: 40 meq via ORAL
  Filled 2012-12-04 (×2): qty 2

## 2012-12-04 NOTE — Anesthesia Postprocedure Evaluation (Signed)
  Anesthesia Post-op Note  Patient: [name]  Procedure(s) Performed: Procedure(s) (LRB): INTRAMEDULLARY (IM) NAIL INTERTROCHANTRIC (Left)  Patient Location: PACU  Anesthesia Type: Spinal  Level of Consciousness: awake and alert   Airway and Oxygen Therapy: Patient Spontanous Breathing  Post-op Pain: mild  Post-op Assessment: Post-op Vital signs reviewed, Patient's Cardiovascular Status Stable, Respiratory Function Stable, Patent Airway and No signs of Nausea or vomiting  Last Vitals:  Filed Vitals:   12/04/12 0028  BP: 102/70  Pulse: 89  Temp: 36.9 C  Resp: 14    Post-op Vital Signs: stable   Complications: No apparent anesthesia complications

## 2012-12-04 NOTE — Consult Note (Signed)
Physical Medicine and Rehabilitation Consult Reason for Consult: Left intertrochanteric hip fracture Referring Physician: [facility]   HPI: [name] is a 79 y.o. right-handed male with history of coronary artery disease as well as hypertension. Patient independent prior to admission. Admitted 12/03/2012 after a fall when he got tangled up in the telephone cables and fell on his left side without loss of consciousness. Sustained left intertrochanteric hip fracture. Underwent intramedullary nailing of left hip 12/03/2012 per Dr. Victorino Dike. Placed on subcutaneous heparin for DVT prophylaxis. Advised weightbearing as tolerated. Physical therapy evaluation completed 12/04/2012 with recommendations of physical medicine rehabilitation consult to consider inpatient rehabilitation services.  Review of Systems  Genitourinary: Positive for frequency.  Musculoskeletal: Positive for myalgias and joint pain.  All other systems reviewed and are negative.   Past Medical History  Diagnosis Date  . CORONARY ARTERY DISEASE 12/26/2006  . HYPERLIPIDEMIA 12/26/2006  . HYPERTENSION 12/26/2006  . PERIPHERAL VASCULAR DISEASE 12/26/2006   Past Surgical History  Procedure Laterality Date  . Abdominal aortic aneurysm repair    . Transurethral resection of prostate    . Hernia repair      ingunial  . Cardiac catheterization    . Intramedullary (im) nail intertrochanteric Left 12/03/2012    Procedure: INTRAMEDULLARY (IM) NAIL INTERTROCHANTRIC;  Surgeon: Toni Arthurs, MD;  Location: [facility];  Service: Orthopedics;  Laterality: Left;   Family History  Problem Relation Age of Onset  . Diabetes Mother   . Hypertension Mother    Social History:  reports that he quit smoking about 34 years ago. His smoking use included Cigarettes. He smoked 0.50 packs per day. He has never used smokeless tobacco. He reports that he drinks about 0.6 ounces of alcohol per week. He reports that he does not use illicit drugs. Allergies:   Allergies  Allergen Reactions  . Cardura (Doxazosin Mesylate) Other (See Comments)    Upset stomach   Medications Prior to Admission  Medication Sig Dispense Refill  . aspirin 81 MG tablet Take 81 mg by mouth every evening.       Marland Kitchen doxazosin (CARDURA) 4 MG tablet Take 4 mg by mouth every morning.      . hydrochlorothiazide (HYDRODIURIL) 25 MG tablet Take 0.5 tablets (12.5 mg total) by mouth daily.  90 tablet  6  . Multiple Vitamin (MULTIVITAMIN WITH MINERALS) TABS Take 1 tablet by mouth every morning.      . simvastatin (ZOCOR) 20 MG tablet Take 1 tablet (20 mg total) by mouth at bedtime.  90 tablet  6  . temazepam (RESTORIL) 15 MG capsule Take 1 capsule (15 mg total) by mouth at bedtime as needed.  90 capsule  1    Home: Home Living Lives With: Spouse Available Help at Discharge: Family Additional Comments: Pt is planning on going to rehab following hospital stay  Functional History:   Functional Status:  Mobility: Bed Mobility Bed Mobility: Supine to Sit Supine to Sit: 1: +2 Total assist;HOB elevated;With rails Supine to Sit: Patient Percentage: 40% Transfers Transfers: Sit to Stand;Stand to Sit Sit to Stand: 1: +2 Total assist;From elevated surface;From bed Sit to Stand: Patient Percentage: 60% Stand to Sit: 1: +2 Total assist;With upper extremity assist;With armrests Stand to Sit: Patient Percentage: 60% Ambulation/Gait Ambulation/Gait Assistance: 1: +2 Total assist Ambulation/Gait: Patient Percentage: 70% Ambulation Distance (Feet): 45 Feet Assistive device: Rolling walker Ambulation/Gait Assistance Details: Cues for sequencing/technique with RW, increasing WB through LLE and upright posture.  Gait Pattern: Step-to pattern;Decreased stride length;Trunk flexed Gait  velocity: decreased Stairs: No Wheelchair Mobility Wheelchair Mobility: No  ADL:    Cognition: Cognition Overall Cognitive Status: Within Functional Limits for tasks assessed Arousal/Alertness:  Awake/alert Orientation Level: Oriented X4 Cognition Arousal/Alertness: Awake/alert Behavior During Therapy: WFL for tasks assessed/performed Overall Cognitive Status: Within Functional Limits for tasks assessed  Blood pressure 112/61, pulse 96, temperature 98.7 F (37.1 C), temperature source Oral, resp. rate 16, height 5\' 5"  (1.651 m), weight 76.204 kg (168 lb), SpO2 94.00%. Physical Exam  Vitals reviewed. Constitutional: He is oriented to person, place, and time. He appears well-developed. No distress.  HENT:  Head: Normocephalic.  Eyes: EOM are normal.  Neck: Normal range of motion. Neck supple. No JVD present. No tracheal deviation present. No thyromegaly present.  Cardiovascular: Normal rate and regular rhythm.   Pulmonary/Chest: Effort normal and breath sounds normal. No respiratory distress.  Abdominal: Soft. Bowel sounds are normal. He exhibits no distension.  Musculoskeletal:  Left hip appropriately tender/dressed.  Lymphadenopathy:    He has no cervical adenopathy.  Neurological: He is alert and oriented to person, place, and time. No cranial nerve deficit. Coordination normal.  HOH.   Skin: He is not diaphoretic.  Hip incision clean and dry  Psychiatric: He has a normal mood and affect. His behavior is normal. Judgment and thought content normal.    Results for orders placed during the hospital encounter of 12/03/12 (from the past 24 hour(s))  BASIC METABOLIC PANEL     Status: Abnormal   Collection Time    12/03/12  6:25 PM      Result Value Range   Sodium 140  135 - 145 mEq/L   Potassium 3.2 (*) 3.5 - 5.1 mEq/L   Chloride 102  96 - 112 mEq/L   CO2 28  19 - 32 mEq/L   Glucose, Bld 119 (*) 70 - 99 mg/dL   BUN 22  6 - 23 mg/dL   Creatinine, Ser 1.61  0.50 - 1.35 mg/dL   Calcium 9.2  8.4 - 09.6 mg/dL   GFR calc non Af Amer 66 (*) >90 mL/min   GFR calc Af Amer 76 (*) >90 mL/min  CBC WITH DIFFERENTIAL     Status: Abnormal   Collection Time    12/03/12  6:25 PM       Result Value Range   WBC 13.4 (*) 4.0 - 10.5 K/uL   RBC 5.07  4.22 - 5.81 MIL/uL   Hemoglobin 15.5  13.0 - 17.0 g/dL   HCT 04.5  40.9 - 81.1 %   MCV 87.2  78.0 - 100.0 fL   MCH 30.6  26.0 - 34.0 pg   MCHC 35.1  30.0 - 36.0 g/dL   RDW 91.4  78.2 - 95.6 %   Platelets 111 (*) 150 - 400 K/uL   Neutrophils Relative % 86 (*) 43 - 77 %   Neutro Abs 11.5 (*) 1.7 - 7.7 K/uL   Lymphocytes Relative 7 (*) 12 - 46 %   Lymphs Abs 1.0  0.7 - 4.0 K/uL   Monocytes Relative 7  3 - 12 %   Monocytes Absolute 0.9  0.1 - 1.0 K/uL   Eosinophils Relative 0  0 - 5 %   Eosinophils Absolute 0.0  0.0 - 0.7 K/uL   Basophils Relative 0  0 - 1 %   Basophils Absolute 0.0  0.0 - 0.1 K/uL  PROTIME-INR     Status: None   Collection Time    12/03/12  6:25 PM  Result Value Range   Prothrombin Time 14.0  11.6 - 15.2 seconds   INR 1.09  0.00 - 1.49  TYPE AND SCREEN     Status: None   Collection Time    12/03/12  6:25 PM      Result Value Range   ABO/RH(D) O POS     Antibody Screen NEG     Sample Expiration 12/06/2012    ABO/RH     Status: None   Collection Time    12/03/12  6:25 PM      Result Value Range   ABO/RH(D) O POS    CBC     Status: Abnormal   Collection Time    12/04/12  2:20 AM      Result Value Range   WBC 11.2 (*) 4.0 - 10.5 K/uL   RBC 4.69  4.22 - 5.81 MIL/uL   Hemoglobin 13.9  13.0 - 17.0 g/dL   HCT 96.0  45.4 - 09.8 %   MCV 87.8  78.0 - 100.0 fL   MCH 29.6  26.0 - 34.0 pg   MCHC 33.7  30.0 - 36.0 g/dL   RDW 11.9  14.7 - 82.9 %   Platelets 93 (*) 150 - 400 K/uL  COMPREHENSIVE METABOLIC PANEL     Status: Abnormal   Collection Time    12/04/12  2:20 AM      Result Value Range   Sodium 138  135 - 145 mEq/L   Potassium 3.3 (*) 3.5 - 5.1 mEq/L   Chloride 103  96 - 112 mEq/L   CO2 28  19 - 32 mEq/L   Glucose, Bld 129 (*) 70 - 99 mg/dL   BUN 20  6 - 23 mg/dL   Creatinine, Ser 5.62  0.50 - 1.35 mg/dL   Calcium 8.6  8.4 - 13.0 mg/dL   Total Protein 5.5 (*) 6.0 - 8.3 g/dL    Albumin 3.2 (*) 3.5 - 5.2 g/dL   AST 21  0 - 37 U/L   ALT 18  0 - 53 U/L   Alkaline Phosphatase 53  39 - 117 U/L   Total Bilirubin 0.6  0.3 - 1.2 mg/dL   GFR calc non Af Amer 64 (*) >90 mL/min   GFR calc Af Amer 74 (*) >90 mL/min  PROTIME-INR     Status: None   Collection Time    12/04/12  2:20 AM      Result Value Range   Prothrombin Time 14.4  11.6 - 15.2 seconds   INR 1.14  0.00 - 1.49   Dg Hip Complete Left  12/03/2012   *RADIOLOGY REPORT*  Clinical Data: Fall with left hip pain.  LEFT HIP - COMPLETE 2+ VIEW  Comparison: None.  Findings: No displaced fractures are identified.  There is subtle lucency and cortical irregularity along the superior aspect of the greater trochanter which could potentially represent a nondisplaced incomplete fracture.  Mild osteoarthritis present in the left hip. Soft tissues are unremarkable.  IMPRESSION: Possible subtle nondisplaced fracture through the greater trochanter.   Original Report Authenticated By: Irish Lack, M.D.   Dg Hip Operative Left  12/03/2012   *RADIOLOGY REPORT*  Clinical Data: ORIF of the left hip for intertrochanteric fracture.  OPERATIVE LEFT HIP  Comparison: CT hip 12/03/2012  Findings: Four spot fluoroscopic intraoperative spot views were obtained during open reduction internal fixation for an intertrochanteric fracture.  Dynamic hip screw has been placed. Fracture fragments appear in anatomic alignment.  No complicating features  are identified.  IMPRESSION: ORIF for left intertrochanteric femur fracture.   Original Report Authenticated By: Britta Mccreedy, M.D.   Ct Hip Left Wo Contrast  12/03/2012   *RADIOLOGY REPORT*  Clinical Data: Fall, left hip pain.  CT OF THE LEFT HIP WITHOUT CONTRAST  Technique:  Multidetector CT imaging was performed according to the standard protocol. Multiplanar CT image reconstructions were also generated.  Comparison: Left hip plain films performed earlier today.  Findings: There is a fracture involving the  left femoral greater trochanter.  There is subtle extension across the femoral neck, best seen on the sagittal view fracture is nondisplaced.  No dislocation or subluxation no additional acute bony abnormality.  IMPRESSION: Left femoral intertrochanteric fracture.  There is a subtle nondisplaced component extending from the greater trochanter across the femoral neck, best seen on the sagittal views.   Original Report Authenticated By: Charlett Nose, M.D.   Dg Knee Complete 4 Views Left  12/03/2012   *RADIOLOGY REPORT*  Clinical Data: Knee pain status post fall today.  LEFT KNEE - COMPLETE 4+ VIEW  Comparison: None.  Findings: Positioning is limited by the patient's inability to fully extend the knee.  The bones are demineralized. Tricompartmental degenerative changes are present.  There is no evidence of acute fracture, dislocation or large joint effusion. Diffuse vascular calcifications are noted.  IMPRESSION: Tricompartmental degenerative changes at the left knee.  No acute osseous findings demonstrated.   Original Report Authenticated By: Carey Bullocks, M.D.    Assessment/Plan: Diagnosis: left IT hip fx 1. Does the need for close, 24 hr/day medical supervision in concert with the patient's rehab needs make it unreasonable for this patient to be served in a less intensive setting? Yes 2. Co-Morbidities requiring supervision/potential complications: htn, cad, pvd 3. Due to bladder management, bowel management, safety, skin/wound care, disease management, medication administration, pain management and patient education, does the patient require 24 hr/day rehab nursing? Yes 4. Does the patient require coordinated care of a physician, rehab nurse, PT (1-2 hrs/day, 5 days/week) and OT (1-2 hrs/day, 5 days/week) to address physical and functional deficits in the context of the above medical diagnosis(es)? Yes Addressing deficits in the following areas: balance, endurance, locomotion, strength, transferring,  bowel/bladder control, bathing, dressing, feeding, grooming, toileting and psychosocial support 5. Can the patient actively participate in an intensive therapy program of at least 3 hrs of therapy per day at least 5 days per week? Yes 6. The potential for patient to make measurable gains while on inpatient rehab is good 7. Anticipated functional outcomes upon discharge from inpatient rehab are mod I with PT, mod I to supervision with OT, n/a with SLP. 8. Estimated rehab length of stay to reach the above functional goals is: 10 days 9. Does the patient have adequate social supports to accommodate these discharge functional goals? Potentially 10. Anticipated D/C setting: Home 11. Anticipated post D/C treatments: HH therapy 12. Overall Rehab/Functional Prognosis: good  RECOMMENDATIONS: This patient's condition is appropriate for continued rehabilitative care in the following setting: CIR Patient has agreed to participate in recommended program. Yes/Potentially Note that insurance prior authorization may be required for reimbursement for recommended care.  Comment: Pt lives in a split level house with a wife who can provide supervision at best. If home mods can be made or if patient could stay with daughter upon dc, then inpatient rehab is certainly an option. Rehab RN to follow up.   Ranelle Oyster, MD, [place] [facility]     12/04/2012

## 2012-12-04 NOTE — Op Note (Signed)
NAMEJEARL, [name] NO.:  [id number]  MEDICAL RECORD NO.:  [id number]  LOCATION:  1602                         FACILITY:  [facility]  PHYSICIAN:  Toni Arthurs, MD        DATE OF BIRTH:  08/12/1928  DATE OF PROCEDURE:  12/03/2012 DATE OF DISCHARGE:                              OPERATIVE REPORT   PREOPERATIVE DIAGNOSIS:  Left hip intertrochanteric fracture.  POSTOPERATIVE DIAGNOSIS:  Left hip intertrochanteric fracture.  PROCEDURE:  Intramedullary nailing of left hip intertrochanteric fracture.  SURGEON:  Toni Arthurs, MD  ANESTHESIA:  Spinal.  ESTIMATED BLOOD LOSS:  50 mL.  COMPLICATIONS:  None apparent.  DISPOSITION:  Extubated, awake, and stable to recovery.  INDICATIONS FOR PROCEDURE:  The patient is an 79 year old male who tripped over a telephone cord and fell today at work.  He landed on his left hip.  He had immediate pain.  He was unable to bear weight.  He was seen in the emergency department where x-rays and a CT scan revealed a left hip intertrochanteric fracture.  This is slightly displaced proximally and nondisplaced distally.  He presents now for operative treatment of this injury.  He understands the risks and benefits, the alternative treatment options and elects surgical treatment.  He specifically understands risks of bleeding, infection, nerve damage, blood clots, need for additional surgery, amputation, and death.  PROCEDURE IN DETAIL:  After preoperative consent was obtained and the correct operative site was identified, the patient was brought to the operating room on a hospital bed.  Spinal anesthesia was administered. The patient was then moved onto the fracture table.  The right lower extremity was positioned in a well leg holder and the left lower extremity was positioned in a traction boot.  AP and lateral fluoroscopic images confirmed appropriate reduction of the fracture. The left lower extremity was then prepped and draped in  standard sterile fashion.  A longitudinal incision was made proximal from the tip of the greater trochanter.  Sharp dissection was carried down through the skin and subcutaneous tissue.  The IT band was incised in line with its fibers along the gluteal fascia.  The guidewire was inserted at the tip of the greater trochanter.  AP and lateral fluoroscopic images confirmed appropriate position of the guide pin.  It was then advanced into the medullary canal.  The starter reamer was then used to open the medullary canal.  A short Biomet Affixus nail was then inserted.  The targeting jig was then used to identify the femoral head on the lateral view.  A guide pin was inserted through a stab incision in the lateral aspect of the thigh.  The pin was advanced to the subchondral bone of the femoral head in the center-center position.  AP and lateral fluoroscopic images confirmed appropriate position of the guide pin.  The guide pin was then over reamed to a depth of 100 mm and a 95-mm screw was inserted.  It was noted to have excellent purchase.  The fracture site was then compressed using the compression wheel.  The guide pin was removed.  The targeting jig was then used to insert a distal interlocking screw in percutaneous  fashion at the distal end of the nail.  Final AP and lateral images were obtained at the proximal and distal end of the nail showing appropriate position and length of all hardware and appropriate reduction and compression of the fracture site.  All the wounds were irrigated copiously.  The gluteal fascia and IT band were closed with 0 Vicryl simple sutures.  Inverted simple sutures of 3-0 Monocryl were used to close the subcutaneous tissue and a running 3-0 nylon was used to close the skin incision.  3-0 nylon skin sutures were used to close the other 2 stab incisions.  Sterile dressings were applied.  The patient was awakened from anesthesia and transported to the recovery  room in stable condition.  FOLLOWUP PLAN:  The patient will be weightbearing as tolerated on the left lower extremity.  He will have physical therapy and occupational therapy consults and will likely require admission to a short-term rehab facility.  He will start on aspirin 325 mg p.o. daily for DVT prophylaxis along with SCDs.     Toni Arthurs, MD     JH/MEDQ  D:  12/03/2012  T:  12/04/2012  Job:  [id number]

## 2012-12-04 NOTE — Progress Notes (Signed)
Physical Therapy Treatment Patient Details Name: [name] MRN: [id number] DOB: 01/11/1929 Today's Date: 12/04/2012 Time: 4401-0272 PT Time Calculation (min): 23 min  PT Assessment / Plan / Recommendation Comments on Treatment Session  Pt continues to progress well with mobility, however does state increased pain/soreness this afternoon.     Follow Up Recommendations  CIR     Does the patient have the potential to tolerate intense rehabilitation     Barriers to Discharge        Equipment Recommendations  Rolling walker with 5" wheels    Recommendations for Other Services    Frequency Min 5X/week   Plan Discharge plan remains appropriate    Precautions / Restrictions Precautions Precautions: Fall Restrictions Weight Bearing Restrictions: No Other Position/Activity Restrictions: WBAT   Pertinent Vitals/Pain Pt reports increased soreness, however declines pain medicine.     Mobility  Bed Mobility Bed Mobility: Sit to Supine Sit to Supine: 1: +2 Total assist;HOB flat Sit to Supine: Patient Percentage: 60% Details for Bed Mobility Assistance: Assist for BLEs into bed and guarding assist for trunk for safety with cues for adjusting hips once in bed.   Transfers Transfers: Sit to Stand;Stand to Sit Sit to Stand: 4: Min assist;With upper extremity assist;With armrests;From chair/3-in-1 Stand to Sit: 4: Min assist;To bed Details for Transfer Assistance: Cues for hand placement, LE management and safety.  Ambulation/Gait Ambulation/Gait Assistance: 4: Min assist Ambulation Distance (Feet): 55 Feet Assistive device: Rolling walker Ambulation/Gait Assistance Details: Cues for sequencing/technique with RW and maintaining upright posture.  Gait Pattern: Step-to pattern;Decreased stride length;Trunk flexed Gait velocity: decreased    Exercises Total Joint Exercises Ankle Circles/Pumps: AROM;Both;10 reps Quad Sets: AROM;Left;10 reps   PT Diagnosis:    PT Problem List:    PT Treatment Interventions:     PT Goals Acute Rehab PT Goals PT Goal Formulation: With patient Time For Goal Achievement: 12/11/12 Potential to Achieve Goals: Good Pt will go Sit to Supine/Side: with min assist PT Goal: Sit to Supine/Side - Progress: Progressing toward goal Pt will go Sit to Stand: with supervision PT Goal: Sit to Stand - Progress: Updated due to goal met Pt will go Stand to Sit: with supervision PT Goal: Stand to Sit - Progress: Updated due to goals met Pt will Ambulate: 51 - 150 feet;with supervision;with least restrictive assistive device PT Goal: Ambulate - Progress: Progressing toward goal Pt will Perform Home Exercise Program: with supervision, verbal cues required/provided PT Goal: Perform Home Exercise Program - Progress: Progressing toward goal  Visit Information  Last PT Received On: 12/04/12 Assistance Needed: +2    Subjective Data  Subjective: Its harder this afternoon (walking) Patient Stated Goal: to go to rehab then home.    Cognition  Cognition Arousal/Alertness: Awake/alert Behavior During Therapy: WFL for tasks assessed/performed Overall Cognitive Status: Within Functional Limits for tasks assessed    Balance     End of Session PT - End of Session Equipment Utilized During Treatment: Gait belt Activity Tolerance: Patient tolerated treatment well Patient left: in bed;with call bell/phone within reach;with family/visitor present Nurse Communication: Mobility status   GP     Vista Deck 12/04/2012, 2:28 PM

## 2012-12-04 NOTE — Progress Notes (Signed)
Clinical Social Work Department BRIEF PSYCHOSOCIAL ASSESSMENT 12/04/2012  Patient:  [name], [name]     Account Number:  [id number]     Admit date:  12/03/2012  Clinical Social Worker:  Candie Chroman  Date/Time:  12/04/2012 07:51 AM  Referred by:  Physician  Date Referred:  12/04/2012 Referred for  SNF Placement   Other Referral:   Interview type:  Patient Other interview type:    PSYCHOSOCIAL DATA Living Status:  WIFE Admitted from facility:   Level of care:   Primary support name:  Carmela Bergstresser Primary support relationship to patient:  SPOUSE Degree of support available:   limited due to medical issues    CURRENT CONCERNS Current Concerns  Post-Acute Placement   Other Concerns:    SOCIAL WORK ASSESSMENT / PLAN Pt is an 79 yr old gentleman living at home prior to hospitalization. Pt fell yesterday and had surgery for a hip fx. CSW met with pt  this am to assist with d/c planning. PT Eval pending. Pt feels ST Rehab would be needed following hospital d/c. SNF list provided to pt. SNF search will be initiated and bed offers provided as received.   Assessment/plan status:  Psychosocial Support/Ongoing Assessment of Needs Other assessment/ plan:   Information/referral to community resources:   SNF list provided.    PATIENT'S/FAMILY'S RESPONSE TO PLAN OF CARE: Pt feels he would benefit from a short time in a rehab facility prior to returning home.   Cori Razor LCSW [phone]

## 2012-12-04 NOTE — Progress Notes (Signed)
Utilization review completed.  

## 2012-12-04 NOTE — Progress Notes (Signed)
[facility] PROGRESS NOTE  [name] ZOX:[id number] DOB: 1929/01/11 DOA: 12/03/2012 PCP: Rogelia Boga, MD  Assessment/Plan: Intertrochanteric fracture of left femur/preop evaluation:  -s/p surgery PT/ot SNF/CIR  HYPERLIPIDEMIA:  - Continue statins.  HYPERTENSION:  - Resume home medications   Hypokalemia:  Replace.   Leukocytosis:  - Reactive- decreasing   Code Status: full Family Communication: wife/daughter Disposition Plan: SNF   Consultants:  ortho  Procedures:    Antibiotics:    HPI/Subjective: feeling well- up in chair eating breakfast  Objective: Filed Vitals:   12/04/12 0330 12/04/12 0400 12/04/12 0528 12/04/12 1005  BP: 113/70  125/75 112/61  Pulse: 73  75 96  Temp: 99.2 F (37.3 C)  99.2 F (37.3 C) 98.7 F (37.1 C)  TempSrc: Oral   Oral  Resp: 16 16 14 16   Height:      Weight:      SpO2: 95% 95% 94% 94%    Intake/Output Summary (Last 24 hours) at 12/04/12 1121 Last data filed at 12/04/12 0900  Gross per 24 hour  Intake   2130 ml  Output    965 ml  Net   1165 ml   Filed Weights   12/04/12 0108  Weight: 76.204 kg (168 lb)    Exam:   General:  A+Ox3, NAD  Cardiovascular: rrr  Respiratory: clear anterior  Abdomen: +BS, soft, NT  Musculoskeletal: moves all 4 ext  Data Reviewed: Basic Metabolic Panel:  Recent Labs Lab 12/03/12 1825 12/04/12 0220  NA 140 138  K 3.2* 3.3*  CL 102 103  CO2 28 28  GLUCOSE 119* 129*  BUN 22 20  CREATININE 1.03 1.05  CALCIUM 9.2 8.6   Liver Function Tests:  Recent Labs Lab 12/04/12 0220  AST 21  ALT 18  ALKPHOS 53  BILITOT 0.6  PROT 5.5*  ALBUMIN 3.2*   No results found for this basename: LIPASE, AMYLASE,  in the last 168 hours No results found for this basename: AMMONIA,  in the last 168 hours CBC:  Recent Labs Lab 12/03/12 1825 12/04/12 0220  WBC 13.4* 11.2*  NEUTROABS 11.5*  --   HGB 15.5 13.9  HCT 44.2 41.2  MCV 87.2 87.8  PLT 111* 93*    Cardiac Enzymes: No results found for this basename: CKTOTAL, CKMB, CKMBINDEX, TROPONINI,  in the last 168 hours BNP (last 3 results) No results found for this basename: PROBNP,  in the last 8760 hours CBG: No results found for this basename: GLUCAP,  in the last 168 hours  No results found for this or any previous visit (from the past 240 hour(s)).   Studies: Dg Hip Complete Left  12/03/2012   *RADIOLOGY REPORT*  Clinical Data: Fall with left hip pain.  LEFT HIP - COMPLETE 2+ VIEW  Comparison: None.  Findings: No displaced fractures are identified.  There is subtle lucency and cortical irregularity along the superior aspect of the greater trochanter which could potentially represent a nondisplaced incomplete fracture.  Mild osteoarthritis present in the left hip. Soft tissues are unremarkable.  IMPRESSION: Possible subtle nondisplaced fracture through the greater trochanter.   Original Report Authenticated By: Irish Lack, M.D.   Dg Hip Operative Left  12/03/2012   *RADIOLOGY REPORT*  Clinical Data: ORIF of the left hip for intertrochanteric fracture.  OPERATIVE LEFT HIP  Comparison: CT hip 12/03/2012  Findings: Four spot fluoroscopic intraoperative spot views were obtained during open reduction internal fixation for an intertrochanteric fracture.  Dynamic hip screw has been placed. Fracture  fragments appear in anatomic alignment.  No complicating features are identified.  IMPRESSION: ORIF for left intertrochanteric femur fracture.   Original Report Authenticated By: Britta Mccreedy, M.D.   Ct Hip Left Wo Contrast  12/03/2012   *RADIOLOGY REPORT*  Clinical Data: Fall, left hip pain.  CT OF THE LEFT HIP WITHOUT CONTRAST  Technique:  Multidetector CT imaging was performed according to the standard protocol. Multiplanar CT image reconstructions were also generated.  Comparison: Left hip plain films performed earlier today.  Findings: There is a fracture involving the left femoral greater  trochanter.  There is subtle extension across the femoral neck, best seen on the sagittal view fracture is nondisplaced.  No dislocation or subluxation no additional acute bony abnormality.  IMPRESSION: Left femoral intertrochanteric fracture.  There is a subtle nondisplaced component extending from the greater trochanter across the femoral neck, best seen on the sagittal views.   Original Report Authenticated By: Charlett Nose, M.D.   Dg Knee Complete 4 Views Left  12/03/2012   *RADIOLOGY REPORT*  Clinical Data: Knee pain status post fall today.  LEFT KNEE - COMPLETE 4+ VIEW  Comparison: None.  Findings: Positioning is limited by the patient's inability to fully extend the knee.  The bones are demineralized. Tricompartmental degenerative changes are present.  There is no evidence of acute fracture, dislocation or large joint effusion. Diffuse vascular calcifications are noted.  IMPRESSION: Tricompartmental degenerative changes at the left knee.  No acute osseous findings demonstrated.   Original Report Authenticated By: Carey Bullocks, M.D.    Scheduled Meds: . sodium chloride   Intravenous STAT  . aspirin EC  325 mg Oral Q breakfast  . docusate sodium  100 mg Oral BID  . [START ON 12/05/2012] doxazosin  4 mg Oral q morning - 10a  . heparin  5,000 Units Subcutaneous Q8H  . hydrochlorothiazide  12.5 mg Oral Daily  . potassium chloride  40 mEq Oral BID  . senna  2 tablet Oral BID  . simvastatin  20 mg Oral QHS  . sodium chloride  3 mL Intravenous Q12H   Continuous Infusions: . sodium chloride 100 mL/hr at 12/04/12 1610    Principal Problem:   Intertrochanteric fracture of left femur Active Problems:   HYPERLIPIDEMIA   HYPERTENSION    Time spent: 35    [facility], Cantrell Larouche  [facility] Pager [phone]. If 7PM-7AM, please contact night-coverage at [email or web address], password [facility] 12/04/2012, 11:21 AM  LOS: 1 day

## 2012-12-04 NOTE — Evaluation (Signed)
Occupational Therapy Evaluation Patient Details Name: [name] MRN: [id number] DOB: 01/31/1929 Today's Date: 12/04/2012 Time: 0117-0140 OT Time Calculation (min): 23 min  OT Assessment / Plan / Recommendation Clinical Impression  This 79 year old man was admitted for L femur fx which was managed with IM nail.  He is WBAT.  Pt was independent with adls prior to admission.  He will benefit from skilled OT with min A to supervision level goals in acute.      OT Assessment  Patient needs continued OT Services    Follow Up Recommendations  CIR    Barriers to Discharge      Equipment Recommendations  3 in 1 bedside comode    Recommendations for Other Services    Frequency  Min 2X/week    Precautions / Restrictions Precautions Precautions: Fall Restrictions Weight Bearing Restrictions: No Other Position/Activity Restrictions: WBAT   Pertinent Vitals/Pain Pain with weight bearing:  Moderate.  Pt did not want pain meds.  Repositioned and ice applied    ADL  Grooming: Set up Where Assessed - Grooming: Unsupported sitting Upper Body Bathing: Set up Where Assessed - Upper Body Bathing: Unsupported sitting Lower Body Bathing: Moderate assistance Where Assessed - Lower Body Bathing: Unsupported sitting Upper Body Dressing: Set up Where Assessed - Upper Body Dressing: Unsupported sitting Lower Body Dressing: Maximal assistance Where Assessed - Lower Body Dressing: Supported sit to stand Toilet Transfer: Simulated;Minimal assistance Toilet Transfer Method: Sit to stand Toileting - Architect and Hygiene: Simulated;Min guard Where Assessed - Engineer, mining and Hygiene: Sit to stand from 3-in-1 or toilet Equipment Used: Rolling walker Transfers/Ambulation Related to ADLs: Ambulated in room and hall with min A.  Cues for hand and foot placement during transfers ADL Comments: Did not introduce AE on this visit but pt would benefit from theses    OT  Diagnosis: Generalized weakness  OT Problem List: Pain;Decreased strength;Decreased safety awareness;Decreased knowledge of use of DME or AE OT Treatment Interventions: Self-care/ADL training;DME and/or AE instruction;Patient/family education;Therapeutic activities   OT Goals Acute Rehab OT Goals OT Goal Formulation: With patient Time For Goal Achievement: 12/18/12 Potential to Achieve Goals: Good ADL Goals Pt Will Perform Grooming: with min assist;Standing at sink;Supported (min guard) ADL Goal: Grooming - Progress: Goal set today Pt Will Perform Lower Body Bathing: with min assist;Sit to stand from chair (with AE) ADL Goal: Lower Body Bathing - Progress: Goal set today Pt Will Perform Lower Body Dressing: with min assist;Sit to stand from chair;with adaptive equipment ADL Goal: Lower Body Dressing - Progress: Goal set today Pt Will Transfer to Toilet: with min assist;Ambulation;3-in-1 (min guard, all aspects of toileting) ADL Goal: Toilet Transfer - Progress: Goal set today  Visit Information  Last OT Received On: 12/04/12 Assistance Needed: +2 PT/OT Co-Evaluation/Treatment: Yes    Subjective Data  Subjective: This hurts more than it did this morning   Prior Functioning     Home Living Lives With: Spouse Available Help at Discharge: Family Prior Function Level of Independence: Independent Communication Communication: HOH         Vision/Perception     Copywriter, advertising Arousal/Alertness: Awake/alert Behavior During Therapy: WFL for tasks assessed/performed Overall Cognitive Status: Within Functional Limits for tasks assessed    Extremity/Trunk Assessment Right Upper Extremity Assessment RUE ROM/Strength/Tone: Within functional levels Left Upper Extremity Assessment LUE ROM/Strength/Tone: Within functional levels     Mobility Bed Mobility Bed Mobility: Sit to Supine Sit to Supine: 1: +2 Total assist;HOB flat Sit  to Supine: Patient Percentage:  60% Details for Bed Mobility Assistance: Assist for BLEs into bed and guarding assist for trunk for safety with cues for adjusting hips once in bed.   Transfers Sit to Stand: 4: Min assist;With upper extremity assist;With armrests;From chair/3-in-1 Stand to Sit: 4: Min assist;To bed Details for Transfer Assistance: Cues for hand placement, LE management and safety.      Exercise    Balance     End of Session OT - End of Session Activity Tolerance: Patient tolerated treatment well Patient left: in bed;with call bell/phone within reach;with family/visitor present  GO     Martavion Couper 12/04/2012, 2:56 PM Marica Otter, OTR/L [phone] 12/04/2012

## 2012-12-04 NOTE — Evaluation (Signed)
Physical Therapy Evaluation Patient Details Name: [name] MRN: [id number] DOB: October 04, 1928 Today's Date: 12/04/2012 Time: 3086-5784 PT Time Calculation (min): 23 min  PT Assessment / Plan / Recommendation Clinical Impression  Pt presents with L femur fracture s/p IM nail POD 1 with decreased strength, ROM and mobility.  Tolerated OOB and ambulation in hallway well with +2 assist for safety and chair follow.  Pt will benefit from skilled PT in acute venue to address deficits.  PT recommends CIR for follow up at D/C to maximize pts safety and function.      PT Assessment  Patient needs continued PT services    Follow Up Recommendations  CIR    Does the patient have the potential to tolerate intense rehabilitation      Barriers to Discharge None      Equipment Recommendations  Rolling walker with 5" wheels    Recommendations for Other Services OT consult   Frequency Min 5X/week    Precautions / Restrictions Precautions Precautions: Fall Restrictions Weight Bearing Restrictions: No Other Position/Activity Restrictions: WBAT   Pertinent Vitals/Pain 3/10      Mobility  Bed Mobility Bed Mobility: Supine to Sit Supine to Sit: 1: +2 Total assist;HOB elevated;With rails Supine to Sit: Patient Percentage: 40% Details for Bed Mobility Assistance: Assist for LLE out of bed and for trunk to attain sitting position.  Max cues for hand placement and technique to self assist.  Pt did very well scooting to get to EOB.  Transfers Transfers: Sit to Stand;Stand to Sit Sit to Stand: 1: +2 Total assist;From elevated surface;From bed Sit to Stand: Patient Percentage: 60% Stand to Sit: 1: +2 Total assist;With upper extremity assist;With armrests Stand to Sit: Patient Percentage: 60% Details for Transfer Assistance: Assist to rise, steady and ensure controlled descent with cues for hand placement and LE management when sitting/standing.  Ambulation/Gait Ambulation/Gait Assistance: 1:  +2 Total assist Ambulation/Gait: Patient Percentage: 70% Ambulation Distance (Feet): 45 Feet Assistive device: Rolling walker Ambulation/Gait Assistance Details: Cues for sequencing/technique with RW, increasing WB through LLE and upright posture.  Gait Pattern: Step-to pattern;Decreased stride length;Trunk flexed Gait velocity: decreased Stairs: No Wheelchair Mobility Wheelchair Mobility: No    Exercises     PT Diagnosis: Difficulty walking;Abnormality of gait;Acute pain  PT Problem List: Decreased strength;Decreased range of motion;Decreased activity tolerance;Decreased balance;Decreased mobility;Decreased knowledge of use of DME;Decreased safety awareness;Decreased knowledge of precautions;Pain PT Treatment Interventions: DME instruction;Gait training;Functional mobility training;Therapeutic activities;Therapeutic exercise;Balance training;Patient/family education   PT Goals Acute Rehab PT Goals PT Goal Formulation: With patient Time For Goal Achievement: 12/11/12 Potential to Achieve Goals: Good Pt will go Supine/Side to Sit: with min assist PT Goal: Supine/Side to Sit - Progress: Goal set today Pt will go Sit to Supine/Side: with min assist PT Goal: Sit to Supine/Side - Progress: Goal set today Pt will go Sit to Stand: with min assist PT Goal: Sit to Stand - Progress: Goal set today Pt will go Stand to Sit: with min assist PT Goal: Stand to Sit - Progress: Goal set today Pt will Ambulate: 51 - 150 feet;with supervision;with least restrictive assistive device PT Goal: Ambulate - Progress: Goal set today Pt will Perform Home Exercise Program: with supervision, verbal cues required/provided PT Goal: Perform Home Exercise Program - Progress: Goal set today  Visit Information  Last PT Received On: 12/04/12 Assistance Needed: +2    Subjective Data  Subjective: I don't know (about getting up) Patient Stated Goal: to go to rehab then home.  Prior Functioning  Home  Living Lives With: Spouse Available Help at Discharge: Family Additional Comments: Pt is planning on going to rehab following hospital stay Prior Function Level of Independence: Independent Communication Communication: HOH    Cognition  Cognition Arousal/Alertness: Awake/alert Behavior During Therapy: WFL for tasks assessed/performed Overall Cognitive Status: Within Functional Limits for tasks assessed    Extremity/Trunk Assessment Right Lower Extremity Assessment RLE ROM/Strength/Tone: WFL for tasks assessed RLE Sensation: WFL - Light Touch Left Lower Extremity Assessment LLE ROM/Strength/Tone: Unable to fully assess;Due to pain Trunk Assessment Trunk Assessment: Normal   Balance    End of Session PT - End of Session Equipment Utilized During Treatment: Gait belt Activity Tolerance: Patient tolerated treatment well Patient left: in chair;with call bell/phone within reach Nurse Communication: Mobility status  GP     Vista Deck 12/04/2012, 10:19 AM

## 2012-12-04 NOTE — Progress Notes (Signed)
Subjective: 1 Day Post-Op Procedure(s) (LRB): INTRAMEDULLARY (IM) NAIL INTERTROCHANTRIC (Left) Patient reports pain as mild.  Walked with PT today in hallway with walker.  Pain well controlled.  Objective: Vital signs in last 24 hours: Temp:  [98.1 F (36.7 C)-99.2 F (37.3 C)] 99 F (37.2 C) (06/17 1355) Pulse Rate:  [62-96] 85 (06/17 1355) Resp:  [10-18] 16 (06/17 1355) BP: (100-126)/(59-98) 107/98 mmHg (06/17 1355) SpO2:  [90 %-100 %] 93 % (06/17 1355) Weight:  [76.204 kg (168 lb)] 76.204 kg (168 lb) (06/17 0108)  Intake/Output from previous day: 06/16 0701 - 06/17 0700 In: 1890 [P.O.:240; I.V.:1650] Out: 965 [Urine:915; Blood:50] Intake/Output this shift: Total I/O In: 1080 [P.O.:480; I.V.:600] Out: 400 [Urine:400]   Recent Labs  12/03/12 1825 12/04/12 0220  HGB 15.5 13.9    Recent Labs  12/03/12 1825 12/04/12 0220  WBC 13.4* 11.2*  RBC 5.07 4.69  HCT 44.2 41.2  PLT 111* 93*    Recent Labs  12/03/12 1825 12/04/12 0220  NA 140 138  K 3.2* 3.3*  CL 102 103  CO2 28 28  BUN 22 20  CREATININE 1.03 1.05  GLUCOSE 119* 129*  CALCIUM 9.2 8.6    Recent Labs  12/03/12 1825 12/04/12 0220  INR 1.09 1.14    L hip wounds dressed and dry.  NVI at L LE.  Assessment/Plan: 1 Day Post-Op Procedure(s) (LRB): INTRAMEDULLARY (IM) NAIL INTERTROCHANTRIC (Left) Up with therapy  Aspirin and SCDs for DVT prophylaxis.  WBAT on L LE.  Norco for pain.  Toni Arthurs 12/04/2012, 4:46 PM

## 2012-12-04 NOTE — Progress Notes (Signed)
12/04/12 0140 Nursing Dr Victorino Dike called reg lack of post op antibiotic ordered for patient. No orders received.

## 2012-12-04 NOTE — Progress Notes (Signed)
Rehab Admissions Coordinator Note:  Patient was screened by Brock Ra for appropriateness for an Inpatient Acute Rehab Consult.  At this time, we are recommending Inpatient Rehab consult.  Brock Ra 12/04/2012, 10:25 AM  I can be reached at [phone].

## 2012-12-04 NOTE — Progress Notes (Signed)
Clinical Social Work Department CLINICAL SOCIAL WORK PLACEMENT NOTE 12/04/2012  Patient:  ERROLL, WILBOURNE  Account Number:  [id number] Admit date:  12/03/2012  Clinical Social Worker:  Cori Razor, LCSW  Date/time:  12/04/2012 09:48 AM  Clinical Social Work is seeking post-discharge placement for this patient at the following level of care:   SKILLED NURSING   (*CSW will update this form in Epic as items are completed)   12/04/2012  Patient/family provided with [facility] Department of Clinical Social Work's list of facilities offering this level of care within the geographic area requested by the patient (or if unable, by the patient's family).  12/04/2012  Patient/family informed of their freedom to choose among providers that offer the needed level of care, that participate in Medicare, Medicaid or managed care program needed by the patient, have an available bed and are willing to accept the patient.    Patient/family informed of [facility]' ownership interest in [facility], as well as of the fact that they are under no obligation to receive care at this facility.  PASARR submitted to EDS on 12/04/2012 PASARR number received from EDS on 12/04/2012  FL2 transmitted to all facilities in geographic area requested by pt/family on  12/04/2012 FL2 transmitted to all facilities within larger geographic area on   Patient informed that his/her managed care company has contracts with or will negotiate with  certain facilities, including the following:     Patient/family informed of bed offers received:   Patient chooses bed at  Physician recommends and patient chooses bed at    Patient to be transferred to  on   Patient to be transferred to facility by   The following physician request were entered in Epic:   Additional Comments:  Cori Razor LCSW [phone]

## 2012-12-05 DIAGNOSIS — E876 Hypokalemia: Secondary | ICD-10-CM

## 2012-12-05 DIAGNOSIS — D72829 Elevated white blood cell count, unspecified: Secondary | ICD-10-CM

## 2012-12-05 LAB — CBC
MCH: 29.5 pg (ref 26.0–34.0)
MCHC: 33.5 g/dL (ref 30.0–36.0)
MCV: 88 fL (ref 78.0–100.0)
Platelets: 87 10*3/uL — ABNORMAL LOW (ref 150–400)
RDW: 13.4 % (ref 11.5–15.5)
WBC: 9.8 10*3/uL (ref 4.0–10.5)

## 2012-12-05 LAB — BASIC METABOLIC PANEL
BUN: 17 mg/dL (ref 6–23)
Calcium: 8.3 mg/dL — ABNORMAL LOW (ref 8.4–10.5)
Creatinine, Ser: 1.08 mg/dL (ref 0.50–1.35)
GFR calc Af Amer: 72 mL/min — ABNORMAL LOW (ref >90)
GFR calc non Af Amer: 62 mL/min — ABNORMAL LOW (ref >90)

## 2012-12-05 MED ORDER — POTASSIUM CHLORIDE CRYS ER 20 MEQ PO TBCR
40.0000 meq | EXTENDED_RELEASE_TABLET | Freq: Once | ORAL | Status: AC
  Administered 2012-12-05: 40 meq via ORAL
  Filled 2012-12-05: qty 2

## 2012-12-05 NOTE — Progress Notes (Signed)
Physical Therapy Treatment Patient Details Name: [name] MRN: [id number] DOB: Feb 24, 1929 Today's Date: 12/05/2012 Time: 2952-8413 PT Time Calculation (min): 41 min  PT Assessment / Plan / Recommendation Comments on Treatment Session  POD # 2 L IM nail 2nd fx.  Pt already OOB in recliner. Assisted pt to stand with c/o he was "hurting more today than yesterday".  MAX c/o "soreness" and "tightness".  Pt progressing slwoly with gait.  Pt also demon a delayed cognition responce to commands and reqyuired repeat instruction 25% of time.  Pt has a very supportive family and plans to D/C to CIR for rehab.    Follow Up Recommendations  CIR     Does the patient have the potential to tolerate intense rehabilitation     Barriers to Discharge        Equipment Recommendations  Rolling walker with 5" wheels    Recommendations for Other Services    Frequency Min 5X/week   Plan Discharge plan remains appropriate    Precautions / Restrictions Precautions Precautions: Fall Restrictions Weight Bearing Restrictions: No Other Position/Activity Restrictions: WBAT   Pertinent Vitals/Pain C/o MAX "stifness" TE's ICE applied    Mobility  Bed Mobility Bed Mobility: Not assessed Details for Bed Mobility Assistance: Pt OOB in recliner Transfers Transfers: Sit to Stand;Stand to Sit Sit to Stand: 3: Mod assist;From elevated surface;From chair/3-in-1 Stand to Sit: 4: Min assist;To chair/3-in-1 Details for Transfer Assistance: 50% VC's on proper tech and hand placement plus completion of turns prior to sit Ambulation/Gait Ambulation/Gait Assistance: 4: Min assist Ambulation Distance (Feet): 48 Feet Assistive device: Rolling walker Ambulation/Gait Assistance Details: increased time with MAX c/o "stifness" Gait Pattern: Step-to pattern;Decreased stride length;Trunk flexed Gait velocity: decreased    Exercises   L Hip TE's 10 reps ankle pumps 10 reps knee presses 10 reps heel slides 10 reps  SAQ's 10 reps ABD Followed by ICE   PT Goals                                             progressing    Visit Information  Last PT Received On: 12/05/12 Assistance Needed: +1    Subjective Data      Cognition       Balance   fair  End of Session PT - End of Session Equipment Utilized During Treatment: Gait belt Activity Tolerance: Patient limited by fatigue;Patient limited by pain Patient left: in chair;with call bell/phone within reach;with family/visitor present Nurse Communication: Mobility status   Felecia Shelling  PTA [facility]  Acute  Rehab Pager      [phone]

## 2012-12-05 NOTE — Care Management Note (Signed)
    Page 1 of 1   12/05/2012     5:34:43 PM   CARE MANAGEMENT NOTE 12/05/2012  Patient:  [name], [name]   Account Number:  [id number]  Date Initiated:  12/05/2012  Documentation initiated by:  Colleen Can  Subjective/Objective Assessment:   DX LEFT HIP-INTERTROCHANTERIC FRACTURE; IM NALING     Action/Plan:   SNF VS CIR  PT prefers SNF. States wife will not be able to take care of him if only in CIR for 1 week.   Anticipated DC Date:  12/06/2012   Anticipated DC Plan:  SKILLED NURSING FACILITY  In-house referral  Clinical Social Worker      DC Planning Services  CM consult      Choice offered to / List presented to:             Status of service:  Completed, signed off Medicare Important Message given?   (If response is "NO", the following Medicare IM given date fields will be blank) Date Medicare IM given:   Date Additional Medicare IM given:    Discharge Disposition:    Per UR Regulation:    If discussed at Long Length of Stay Meetings, dates discussed:    Comments:

## 2012-12-05 NOTE — Progress Notes (Signed)
Subjective: 2 Days Post-Op Procedure(s) (LRB): INTRAMEDULLARY (IM) NAIL INTERTROCHANTRIC (Left) Patient reports pain as mild.  OOB with PT.  Objective: Vital signs in last 24 hours: Temp:  [98.6 F (37 C)-99.5 F (37.5 C)] 98.6 F (37 C) (06/18 0507) Pulse Rate:  [85-90] 86 (06/18 0507) Resp:  [16] 16 (06/18 0758) BP: (107-146)/(70-98) 146/80 mmHg (06/18 0507) SpO2:  [83 %-94 %] 93 % (06/18 0758)  Intake/Output from previous day: 06/17 0701 - 06/18 0700 In: 1080 [P.O.:480; I.V.:600] Out: 1175 [Urine:1175] Intake/Output this shift: Total I/O In: 240 [P.O.:240] Out: -    Recent Labs  12/03/12 1825 12/04/12 0220 12/05/12 0423  HGB 15.5 13.9 13.8    Recent Labs  12/04/12 0220 12/05/12 0423  WBC 11.2* 9.8  RBC 4.69 4.68  HCT 41.2 41.2  PLT 93* 87*    Recent Labs  12/04/12 0220 12/05/12 0423  NA 138 138  K 3.3* 3.4*  CL 103 104  CO2 28 29  BUN 20 17  CREATININE 1.05 1.08  GLUCOSE 129* 120*  CALCIUM 8.6 8.3*    Recent Labs  12/03/12 1825 12/04/12 0220  INR 1.09 1.14      Assessment/Plan: 2 Days Post-Op Procedure(s) (LRB): INTRAMEDULLARY (IM) NAIL INTERTROCHANTRIC (Left) Pt may be dishcarged to SNF or home at any time.  D/c meds, dvt and wb status addressed in Epic.  F/u with me in 2 weeks in clinic.  Toni Arthurs 12/05/2012, 12:34 PM

## 2012-12-05 NOTE — Progress Notes (Signed)
Occupational Therapy Treatment Patient Details Name: JERROD DAMIANO MRN: [id number] DOB: [birth year] Today's Date: 12/05/2012 Time: 0865-7846 OT Time Calculation (min): 32 min  OT Assessment / Plan / Recommendation Comments on Treatment Session      Follow Up Recommendations  CIR    Barriers to Discharge       Equipment Recommendations  3 in 1 bedside comode    Recommendations for Other Services    Frequency Min 2X/week   Plan      Precautions / Restrictions Restrictions Weight Bearing Restrictions: No Other Position/Activity Restrictions: WBAT   Pertinent Vitals/Pain L hip not hurting bad however, pt showed nonverbal signs of pain in standing    ADL  Lower Body Bathing: Minimal assistance (with AE; mod A for sit to stand) Where Assessed - Lower Body Bathing: Supported sit to stand Lower Body Dressing: Maximal assistance (with AE) Where Assessed - Lower Body Dressing: Supported sit to Pharmacist, hospital: Simulated;Minimal assistance (mod A sit to stand) Equipment Used: Reacher;Rolling walker;Sock aid Transfers/Ambulation Related to ADLs: SPT to chair, max cueing.  Pt needed step by step cues.  HOH ADL Comments: introduced AE.  Pt needs reinforcement    OT Diagnosis:    OT Problem List:   OT Treatment Interventions:     OT Goals ADL Goals Pt Will Perform Grooming: with min assist;Standing at sink;Supported Pt Will Perform Lower Body Bathing: with min assist;Sit to stand from chair ADL Goal: Lower Body Bathing - Progress: Met Pt Will Perform Lower Body Dressing: with min assist;Sit to stand from chair;with adaptive equipment ADL Goal: Lower Body Dressing - Progress: Progressing toward goals Pt Will Transfer to Toilet: with min assist;Ambulation;3-in-1 ADL Goal: Toilet Transfer - Progress: Progressing toward goals  Visit Information  Last OT Received On: 12/05/12 Assistance Needed: +1    Subjective Data      Prior Functioning       Cognition   Cognition Arousal/Alertness: Awake/alert Behavior During Therapy: WFL for tasks assessed/performed Overall Cognitive Status: Within Functional Limits for tasks assessed (needed step by step cues HOH)    Mobility  Bed Mobility Supine to Sit: 2: Max assist (pt 40%) Details for Bed Mobility Assistance: assist for LLE and trunk Transfers Sit to Stand: 3: Mod assist;From bed;From elevated surface Details for Transfer Assistance: cues for hand placement:  several times: pt put hands before hand grips and on lower rung.  Cues for LE position    Exercises      Balance     End of Session OT - End of Session Activity Tolerance: Patient tolerated treatment well Patient left: in chair;with call bell/phone within reach Nurse Communication:  (pt up in chair:  had bed alarm on)  GO     Amiyah Shryock 12/05/2012, 9:43 AM Marica Otter, OTR/L [phone] 12/05/2012

## 2012-12-05 NOTE — Progress Notes (Signed)
Physical Therapy Treatment Patient Details Name: [name] MRN: [id number] DOB: 1928/06/24 Today's Date: 12/05/2012 Time: 1132-1205 PT Time Calculation (min): 33 min  PT Assessment / Plan / Recommendation Comments on Treatment Session  POD # 2 L IM nail 2nd fx.  Pt already OOB in recliner. Assisted pt to stand with c/o he was "hurting more today than yesterday".  MAX c/o "soreness" and "tightness".  Pt progressing slwoly with gait.  Pt also demon a delayed cognition responce to commands and reqyuired repeat instruction 25% of time.  Pt has a very supportive family and plans to D/C to [facility] for rehab.    Follow Up Recommendations  CIR     Does the patient have the potential to tolerate intense rehabilitation     Barriers to Discharge        Equipment Recommendations  Rolling walker with 5" wheels    Recommendations for Other Services    Frequency Min 5X/week   Plan Discharge plan remains appropriate    Precautions / Restrictions Precautions Precautions: Fall Restrictions Weight Bearing Restrictions: No Other Position/Activity Restrictions: WBAT   Pertinent Vitals/Pain C/o 8/10 pain with amb meds requested ICE applied    Mobility  Bed Mobility Bed Mobility: Not assessed Supine to Sit: 2: Max assist (pt 40%) Details for Bed Mobility Assistance: Pt OOB in recliner Transfers Transfers: Sit to Stand;Stand to Sit Sit to Stand: 3: Mod assist;From bed;From elevated surface Stand to Sit: 4: Min assist;To chair/3-in-1 Details for Transfer Assistance: 50% VC's on proper tech and hand placement Ambulation/Gait Ambulation/Gait Assistance: 4: Min assist Ambulation Distance (Feet): 45 Feet Assistive device: Rolling walker Ambulation/Gait Assistance Details: 25% VC's on proper sequencing and walker to self distance Gait Pattern: Step-to pattern;Decreased stride length;Trunk flexed Gait velocity: decreased    PT Goals                                                              progressing    Visit Information  Last PT Received On: 12/05/12 Assistance Needed: +1    Subjective Data      Cognition  Cognition Arousal/Alertness: Awake/alert Behavior During Therapy: WFL for tasks assessed/performed Overall Cognitive Status: Within Functional Limits for tasks assessed (needed step by step cues HOH)    Balance     End of Session PT - End of Session Equipment Utilized During Treatment: Gait belt Activity Tolerance: Patient limited by fatigue;Patient limited by pain Patient left: in chair;with call bell/phone within reach;with family/visitor present Nurse Communication: Mobility status   Felecia Shelling  PTA [facility]  Acute  Rehab Pager      [phone]

## 2012-12-05 NOTE — Progress Notes (Signed)
[facility] PROGRESS NOTE  [name] HYQ:[id number] DOB: [birth year] DOA: 12/03/2012 PCP: Rogelia Boga, MD  Assessment/Plan: Intertrochanteric fracture of left femur/preop evaluation:  -s/p surgery PT/ot SNF/CIR  HYPERLIPIDEMIA:  - Continue statins.  HYPERTENSION:  - Resume home medications   Hypokalemia:  Replace.  Recheck in AM  Leukocytosis:  - Reactive- decreasing   Code Status: full Family Communication: wife/daughter Disposition Plan: SNF vs CIR   Consultants:  ortho  Procedures:    Antibiotics:    HPI/Subjective: No new c/o No CP. No SOB  Objective: Filed Vitals:   12/04/12 1600 12/04/12 2148 12/04/12 2208 12/05/12 0507  BP:  122/70  146/80  Pulse:  90  86  Temp:  99.5 F (37.5 C)  98.6 F (37 C)  TempSrc:  Oral  Oral  Resp: 16 16  16   Height:      Weight:      SpO2: 94% 83% 91% 94%    Intake/Output Summary (Last 24 hours) at 12/05/12 0756 Last data filed at 12/05/12 0508  Gross per 24 hour  Intake   1080 ml  Output   1175 ml  Net    -95 ml   Filed Weights   12/04/12 0108  Weight: 76.204 kg (168 lb)    Exam:   General:  A+Ox3, NAD  Cardiovascular: rrr  Respiratory: clear anterior  Abdomen: +BS, soft, NT  Musculoskeletal: moves all 4 ext  Data Reviewed: Basic Metabolic Panel:  Recent Labs Lab 12/03/12 1825 12/04/12 0220 12/05/12 0423  NA 140 138 138  K 3.2* 3.3* 3.4*  CL 102 103 104  CO2 28 28 29   GLUCOSE 119* 129* 120*  BUN 22 20 17   CREATININE 1.03 1.05 1.08  CALCIUM 9.2 8.6 8.3*   Liver Function Tests:  Recent Labs Lab 12/04/12 0220  AST 21  ALT 18  ALKPHOS 53  BILITOT 0.6  PROT 5.5*  ALBUMIN 3.2*   No results found for this basename: LIPASE, AMYLASE,  in the last 168 hours No results found for this basename: AMMONIA,  in the last 168 hours CBC:  Recent Labs Lab 12/03/12 1825 12/04/12 0220 12/05/12 0423  WBC 13.4* 11.2* 9.8  NEUTROABS 11.5*  --   --   HGB 15.5  13.9 13.8  HCT 44.2 41.2 41.2  MCV 87.2 87.8 88.0  PLT 111* 93* 87*   Cardiac Enzymes: No results found for this basename: CKTOTAL, CKMB, CKMBINDEX, TROPONINI,  in the last 168 hours BNP (last 3 results) No results found for this basename: PROBNP,  in the last 8760 hours CBG: No results found for this basename: GLUCAP,  in the last 168 hours  No results found for this or any previous visit (from the past 240 hour(s)).   Studies: Dg Hip Complete Left  12/03/2012   *RADIOLOGY REPORT*  Clinical Data: Fall with left hip pain.  LEFT HIP - COMPLETE 2+ VIEW  Comparison: None.  Findings: No displaced fractures are identified.  There is subtle lucency and cortical irregularity along the superior aspect of the greater trochanter which could potentially represent a nondisplaced incomplete fracture.  Mild osteoarthritis present in the left hip. Soft tissues are unremarkable.  IMPRESSION: Possible subtle nondisplaced fracture through the greater trochanter.   Original Report Authenticated By: Irish Lack, M.D.   Dg Hip Operative Left  12/03/2012   *RADIOLOGY REPORT*  Clinical Data: ORIF of the left hip for intertrochanteric fracture.  OPERATIVE LEFT HIP  Comparison: CT hip 12/03/2012  Findings: Four spot fluoroscopic  intraoperative spot views were obtained during open reduction internal fixation for an intertrochanteric fracture.  Dynamic hip screw has been placed. Fracture fragments appear in anatomic alignment.  No complicating features are identified.  IMPRESSION: ORIF for left intertrochanteric femur fracture.   Original Report Authenticated By: Britta Mccreedy, M.D.   Ct Hip Left Wo Contrast  12/03/2012   *RADIOLOGY REPORT*  Clinical Data: Fall, left hip pain.  CT OF THE LEFT HIP WITHOUT CONTRAST  Technique:  Multidetector CT imaging was performed according to the standard protocol. Multiplanar CT image reconstructions were also generated.  Comparison: Left hip plain films performed earlier today.   Findings: There is a fracture involving the left femoral greater trochanter.  There is subtle extension across the femoral neck, best seen on the sagittal view fracture is nondisplaced.  No dislocation or subluxation no additional acute bony abnormality.  IMPRESSION: Left femoral intertrochanteric fracture.  There is a subtle nondisplaced component extending from the greater trochanter across the femoral neck, best seen on the sagittal views.   Original Report Authenticated By: Charlett Nose, M.D.   Dg Knee Complete 4 Views Left  12/03/2012   *RADIOLOGY REPORT*  Clinical Data: Knee pain status post fall today.  LEFT KNEE - COMPLETE 4+ VIEW  Comparison: None.  Findings: Positioning is limited by the patient's inability to fully extend the knee.  The bones are demineralized. Tricompartmental degenerative changes are present.  There is no evidence of acute fracture, dislocation or large joint effusion. Diffuse vascular calcifications are noted.  IMPRESSION: Tricompartmental degenerative changes at the left knee.  No acute osseous findings demonstrated.   Original Report Authenticated By: Carey Bullocks, M.D.    Scheduled Meds: . aspirin EC  325 mg Oral Q breakfast  . docusate sodium  100 mg Oral BID  . doxazosin  4 mg Oral q morning - 10a  . heparin  5,000 Units Subcutaneous Q8H  . hydrochlorothiazide  12.5 mg Oral Daily  . potassium chloride  40 mEq Oral Once  . senna  2 tablet Oral BID  . simvastatin  20 mg Oral QHS  . sodium chloride  3 mL Intravenous Q12H   Continuous Infusions: . sodium chloride 100 mL/hr at 12/04/12 1610    Principal Problem:   Intertrochanteric fracture of left femur Active Problems:   HYPERLIPIDEMIA   HYPERTENSION    Time spent: 35    [facility], Crystalina Stodghill  [facility] Pager [phone]. If 7PM-7AM, please contact night-coverage at [email or web address], password [facility] 12/05/2012, 7:56 AM  LOS: 2 days

## 2012-12-05 NOTE — Progress Notes (Signed)
CSW assisting with d/c planning. Met with pt / family at bedside . Pt is interested in ST Rehab at SNF when ready to D/C. Bed offers have been provided.  CIR has been notified. CSW will assist with d/c planning to SNF.  Cori Razor LCSW [phone]

## 2012-12-06 LAB — BASIC METABOLIC PANEL
BUN: 16 mg/dL (ref 6–23)
CO2: 27 mEq/L (ref 19–32)
Calcium: 8.6 mg/dL (ref 8.4–10.5)
Creatinine, Ser: 1 mg/dL (ref 0.50–1.35)
Glucose, Bld: 116 mg/dL — ABNORMAL HIGH (ref 70–99)
Potassium: 3.8 mEq/L (ref 3.5–5.1)

## 2012-12-06 LAB — CBC
MCHC: 34.3 g/dL (ref 30.0–36.0)
Platelets: 82 10*3/uL — ABNORMAL LOW (ref 150–400)
RDW: 13.4 % (ref 11.5–15.5)
WBC: 8.5 10*3/uL (ref 4.0–10.5)

## 2012-12-06 MED ORDER — SENNA 8.6 MG PO TABS: 2.0000 | ORAL_TABLET | Freq: Two times a day (BID) | ORAL | Status: DC

## 2012-12-06 MED ORDER — DSS 100 MG PO CAPS: 100.0000 mg | ORAL_CAPSULE | Freq: Two times a day (BID) | ORAL | Status: DC

## 2012-12-06 NOTE — Progress Notes (Signed)
Rehab admissions - Patient and family prefer SNF to allow additional recovery time.  I will sign off for acute inpatient rehab.  Call me for questions.  #[phone]

## 2012-12-06 NOTE — Progress Notes (Signed)
Physical Therapy Treatment Patient Details Name: GURKIRAT BASHER MRN: [id number] DOB: Jun 27, 1928 Today's Date: 12/06/2012 Time: 4098-1191 PT Time Calculation (min): 25 min  PT Assessment / Plan / Recommendation Comments on Treatment Session  POD # 3 L IM nail 2nd Fx.  Pt OOB in recliner.  Amb in hallway then performed TE's in recliner.  Pt plans to D/C to [facility] today for ST Rehab.  Family asking if pt could go by personal vehicle. With staff, believe pt would be fine.     Follow Up Recommendations  SNF (Family decided on SNF vs CIR)     Does the patient have the potential to tolerate intense rehabilitation     Barriers to Discharge        Equipment Recommendations  Rolling walker with 5" wheels    Recommendations for Other Services    Frequency Min 5X/week   Plan Discharge plan remains appropriate    Precautions / Restrictions Precautions Precautions: Fall Restrictions Weight Bearing Restrictions: No Other Position/Activity Restrictions: WBAT   Pertinent Vitals/Pain C/o 3/10 with gait Repositioned with ICE    Mobility  Bed Mobility Bed Mobility: Not assessed Details for Bed Mobility Assistance: Pt OOB in recliner Transfers Transfers: Sit to Stand;Stand to Sit Sit to Stand: 4: Min assist;3: Mod assist;From chair/3-in-1 Stand to Sit: 4: Min assist;3: Mod assist;To chair/3-in-1 Details for Transfer Assistance: 50% VC's on proper tech and hand placement plus completion of turns prior to sit Ambulation/Gait Ambulation/Gait Assistance: 4: Min Environmental consultant (Feet): 52 Feet Assistive device: Rolling walker Ambulation/Gait Assistance Details: increased time and 25% VC's safety with turns Gait Pattern: Step-to pattern;Decreased stride length;Trunk flexed Gait velocity: decreased    Exercises  L HIP TE's 10 reps ankle pumps 10 reps knee presses 10 reps heel slides 10 reps SAQ's 10 reps ABD Followed by ICE   PT Goals                                                          progressing    Visit Information  Last PT Received On: 12/06/12 Assistance Needed: +1    Subjective Data      Cognition       Balance     End of Session PT - End of Session Equipment Utilized During Treatment: Gait belt Activity Tolerance: Patient tolerated treatment well Patient left: in chair;with call bell/phone within reach;with family/visitor present   Felecia Shelling  PTA [facility]  Acute  Rehab Pager      [phone]

## 2012-12-06 NOTE — Discharge Summary (Addendum)
Physician Discharge Summary  [name] ZOX:[id number] DOB: Oct 27, 1928 DOA: 12/03/2012  PCP: Rogelia Boga, MD  Admit date: 12/03/2012 Discharge date: 12/06/2012  Time spent: 35 minutes  Recommendations for Outpatient Follow-up:  1. CBC 1 week- re platelets 2. BMp 1 week  Discharge Diagnoses:  Principal Problem:   Intertrochanteric fracture of left femur Active Problems:   HYPERLIPIDEMIA   HYPERTENSION   Discharge Condition: improved  Diet recommendation: cardiac  Filed Weights   12/04/12 0108  Weight: 76.204 kg (168 lb)    History of present illness:  [name] is a 79 y.o. male  With past medical history of a aortic abdominal aneurysm repair in 2006, comes in for follow in the office. He relates he went to take some papers to a colleague when he got tangled up in the telephone cables and fell on his left side. He relates since then his been having left-sided pain with an unstable ambulation and pain to movement. He denies any numbness or tingling in his lower extremity. He can walk more than 2 miles without getting short of breath, he can walk up a flight of stairs on a daily basis without any symptoms. He plays golf twice a week and walks the whole course without any difficulties   Hospital Course:  Intertrochanteric fracture of left femur/preop evaluation:  -s/p surgery  PT/ot  SNF ASA  HYPERLIPIDEMIA:  - Continue statins.   HYPERTENSION:  - Resume home medications   Hypokalemia:  Replaced   Leukocytosis:  - Reactive- decreasing   Thrombocytopenia -baseline around 111 -recheck in 1 week- had received heparin    Procedures:  Intramedullary nail  Consultations:  ortho  Discharge Exam: Filed Vitals:   12/05/12 1347 12/05/12 1516 12/05/12 2047 12/06/12 0452  BP: 106/72  121/78 121/78  Pulse: 79  85 90  Temp: 98.9 F (37.2 C)  98.8 F (37.1 C) 98.9 F (37.2 C)  TempSrc: Oral  Oral Oral  Resp: 16 16 14 14   Height:       Weight:      SpO2: 93%  94% 91%    General: pleasant/cooperative Cardiovascular: rrr Respiratory: clear anterior  Discharge Instructions      Discharge Orders   Future Appointments Provider Department Dept Phone   06/14/2013 8:45 AM Gordy Savers, MD [facility] [place] [phone]   Future Orders Complete By Expires     Diet - low sodium heart healthy  As directed     Discharge instructions  As directed     Comments:      CBC, BMP 1 week    Increase activity slowly  As directed     Weight bearing as tolerated  As directed     Comments:      Left lower extremity.        Medication List    STOP taking these medications       aspirin 81 MG tablet      TAKE these medications       aspirin 325 MG EC tablet  Take 1 tablet (325 mg total) by mouth daily with breakfast.     doxazosin 4 MG tablet  Commonly known as:  CARDURA  Take 4 mg by mouth every morning.     DSS 100 MG Caps  Take 100 mg by mouth 2 (two) times daily.     hydrochlorothiazide 25 MG tablet  Commonly known as:  HYDRODIURIL  Take 0.5 tablets (12.5 mg total) by mouth daily.  HYDROcodone-acetaminophen 5-325 MG per tablet  Commonly known as:  NORCO/VICODIN  Take 1 tablet by mouth every 6 (six) hours as needed.     multivitamin with minerals Tabs  Take 1 tablet by mouth every morning.     senna 8.6 MG Tabs  Commonly known as:  SENOKOT  Take 2 tablets (17.2 mg total) by mouth 2 (two) times daily.     simvastatin 20 MG tablet  Commonly known as:  ZOCOR  Take 1 tablet (20 mg total) by mouth at bedtime.     temazepam 15 MG capsule  Commonly known as:  RESTORIL  Take 1 capsule (15 mg total) by mouth at bedtime as needed.       Allergies  Allergen Reactions  . Cardura (Doxazosin Mesylate) Other (See Comments)    Upset stomach with generic doxazosin only.   Follow-up Information   Follow up with HEWITT, Jonny Ruiz, MD. Schedule an appointment as soon as possible for a visit  in 2 weeks.   Contact information:   [address], [address] [place] [address] [phone]        The results of significant diagnostics from this hospitalization (including imaging, microbiology, ancillary and laboratory) are listed below for reference.    Significant Diagnostic Studies: Dg Hip Complete Left  12/03/2012   *RADIOLOGY REPORT*  Clinical Data: Fall with left hip pain.  LEFT HIP - COMPLETE 2+ VIEW  Comparison: None.  Findings: No displaced fractures are identified.  There is subtle lucency and cortical irregularity along the superior aspect of the greater trochanter which could potentially represent a nondisplaced incomplete fracture.  Mild osteoarthritis present in the left hip. Soft tissues are unremarkable.  IMPRESSION: Possible subtle nondisplaced fracture through the greater trochanter.   Original Report Authenticated By: Irish Lack, M.D.   Dg Hip Operative Left  12/03/2012   *RADIOLOGY REPORT*  Clinical Data: ORIF of the left hip for intertrochanteric fracture.  OPERATIVE LEFT HIP  Comparison: CT hip 12/03/2012  Findings: Four spot fluoroscopic intraoperative spot views were obtained during open reduction internal fixation for an intertrochanteric fracture.  Dynamic hip screw has been placed. Fracture fragments appear in anatomic alignment.  No complicating features are identified.  IMPRESSION: ORIF for left intertrochanteric femur fracture.   Original Report Authenticated By: Britta Mccreedy, M.D.   Ct Hip Left Wo Contrast  12/03/2012   *RADIOLOGY REPORT*  Clinical Data: Fall, left hip pain.  CT OF THE LEFT HIP WITHOUT CONTRAST  Technique:  Multidetector CT imaging was performed according to the standard protocol. Multiplanar CT image reconstructions were also generated.  Comparison: Left hip plain films performed earlier today.  Findings: There is a fracture involving the left femoral greater trochanter.  There is subtle extension across the femoral neck, best seen  on the sagittal view fracture is nondisplaced.  No dislocation or subluxation no additional acute bony abnormality.  IMPRESSION: Left femoral intertrochanteric fracture.  There is a subtle nondisplaced component extending from the greater trochanter across the femoral neck, best seen on the sagittal views.   Original Report Authenticated By: Charlett Nose, M.D.   Dg Knee Complete 4 Views Left  12/03/2012   *RADIOLOGY REPORT*  Clinical Data: Knee pain status post fall today.  LEFT KNEE - COMPLETE 4+ VIEW  Comparison: None.  Findings: Positioning is limited by the patient's inability to fully extend the knee.  The bones are demineralized. Tricompartmental degenerative changes are present.  There is no evidence of acute fracture, dislocation or large joint effusion.  Diffuse vascular calcifications are noted.  IMPRESSION: Tricompartmental degenerative changes at the left knee.  No acute osseous findings demonstrated.   Original Report Authenticated By: Carey Bullocks, M.D.    Microbiology: No results found for this or any previous visit (from the past 240 hour(s)).   Labs: Basic Metabolic Panel:  Recent Labs Lab 12/03/12 1825 12/04/12 0220 12/05/12 0423 12/06/12 0523  NA 140 138 138 139  K 3.2* 3.3* 3.4* 3.8  CL 102 103 104 104  CO2 28 28 29 27   GLUCOSE 119* 129* 120* 116*  BUN 22 20 17 16   CREATININE 1.03 1.05 1.08 1.00  CALCIUM 9.2 8.6 8.3* 8.6   Liver Function Tests:  Recent Labs Lab 12/04/12 0220  AST 21  ALT 18  ALKPHOS 53  BILITOT 0.6  PROT 5.5*  ALBUMIN 3.2*   No results found for this basename: LIPASE, AMYLASE,  in the last 168 hours No results found for this basename: AMMONIA,  in the last 168 hours CBC:  Recent Labs Lab 12/03/12 1825 12/04/12 0220 12/05/12 0423 12/06/12 0523  WBC 13.4* 11.2* 9.8 8.5  NEUTROABS 11.5*  --   --   --   HGB 15.5 13.9 13.8 13.6  HCT 44.2 41.2 41.2 39.7  MCV 87.2 87.8 88.0 88.0  PLT 111* 93* 87* 82*   Cardiac Enzymes: No results  found for this basename: CKTOTAL, CKMB, CKMBINDEX, TROPONINI,  in the last 168 hours BNP: BNP (last 3 results) No results found for this basename: PROBNP,  in the last 8760 hours CBG: No results found for this basename: GLUCAP,  in the last 168 hours     Signed:  Marlin Canary  [facility] 12/06/2012, 9:34 AM

## 2012-12-06 NOTE — Progress Notes (Signed)
Clinical Social Work Department CLINICAL SOCIAL WORK PLACEMENT NOTE 12/06/2012  Patient:  [name], [name]  Account Number:  [id number] Admit date:  12/03/2012  Clinical Social Worker:  Cori Razor, LCSW  Date/time:  12/04/2012 09:48 AM  Clinical Social Work is seeking post-discharge placement for this patient at the following level of care:   SKILLED NURSING   (*CSW will update this form in Epic as items are completed)   12/04/2012  Patient/family provided with [facility] Department of Clinical Social Work's list of facilities offering this level of care within the geographic area requested by the patient (or if unable, by the patient's family).  12/04/2012  Patient/family informed of their freedom to choose among providers that offer the needed level of care, that participate in Medicare, Medicaid or managed care program needed by the patient, have an available bed and are willing to accept the patient.    Patient/family informed of [facility]' ownership interest in [facility], as well as of the fact that they are under no obligation to receive care at this facility.  PASARR submitted to EDS on 12/04/2012 PASARR number received from EDS on 12/04/2012  FL2 transmitted to all facilities in geographic area requested by pt/family on  12/04/2012 FL2 transmitted to all facilities within larger geographic area on   Patient informed that his/her managed care company has contracts with or will negotiate with  certain facilities, including the following:     Patient/family informed of bed offers received:  12/04/2012 Patient chooses bed at [facility] Physician recommends and patient chooses bed at    Patient to be transferred to [facility] on  12/06/2012 Patient to be transferred to facility by FAMILY  The following physician request were entered in Epic:   Additional Comments:  Cori Razor LCSW [phone]

## 2012-12-06 NOTE — Progress Notes (Signed)
Pt has ST Rehab bed at [facility] today if stable for d/c. CSW will follow to assist with d/c planning to SNF.  Cori Razor LCSW [phone]

## 2012-12-07 NOTE — ED Provider Notes (Signed)
Medical screening examination/treatment/procedure(s) were conducted as a shared visit with non-physician practitioner(s) and myself.  I personally evaluated the patient during the encounter  Pt presenting with c/o fall.  He has evidence of intertrochanteric femur fracture on CT scan.  Pt without complaints on my exam.  He is awake and alert and oriented x 3.  Admitted to medicine, orthopedics consulted.   Ethelda Chick, MD 12/07/12 [phone]

## 2012-12-11 ENCOUNTER — Ambulatory Visit: Payer: Medicare Other | Admitting: Internal Medicine

## 2013-01-07 ENCOUNTER — Telehealth: Payer: Self-pay | Admitting: Internal Medicine

## 2013-01-07 NOTE — Telephone Encounter (Signed)
Suggest return office visit; patient may need down titration of his blood pressure medication

## 2013-01-07 NOTE — Telephone Encounter (Signed)
Pt had to cancel 6/24 87mo f/u. He states it was d/t BP, and since his June hip surgery, people have been checking his BP all the time & it's been low (90s over 70s). He wants to know: does he still need to come in? Please advise and call.

## 2013-01-08 NOTE — Telephone Encounter (Signed)
Spoke to pt told him Dr. Kirtland Bouchard would like him to make a return visit may need to adjust blood pressure medication. Pt verbalized understanding.

## 2013-01-11 ENCOUNTER — Encounter: Payer: Self-pay | Admitting: Internal Medicine

## 2013-01-11 ENCOUNTER — Ambulatory Visit (INDEPENDENT_AMBULATORY_CARE_PROVIDER_SITE_OTHER): Payer: Medicare Other | Admitting: Internal Medicine

## 2013-01-11 VITALS — BP 130/80 | HR 61 | Temp 97.9°F | Resp 20 | Wt 170.0 lb

## 2013-01-11 DIAGNOSIS — S72142S Displaced intertrochanteric fracture of left femur, sequela: Secondary | ICD-10-CM

## 2013-01-11 DIAGNOSIS — I739 Peripheral vascular disease, unspecified: Secondary | ICD-10-CM

## 2013-01-11 DIAGNOSIS — I2510 Atherosclerotic heart disease of native coronary artery without angina pectoris: Secondary | ICD-10-CM

## 2013-01-11 DIAGNOSIS — E785 Hyperlipidemia, unspecified: Secondary | ICD-10-CM

## 2013-01-11 DIAGNOSIS — S72009S Fracture of unspecified part of neck of unspecified femur, sequela: Secondary | ICD-10-CM

## 2013-01-11 DIAGNOSIS — I10 Essential (primary) hypertension: Secondary | ICD-10-CM

## 2013-01-11 NOTE — Patient Instructions (Signed)
Limit your sodium (Salt) intake  Return in 6 months for follow-up  

## 2013-01-11 NOTE — Progress Notes (Signed)
Subjective:    Patient ID: [name], male    DOB: May 02, 1929, 79 y.o.   MRN: [id number]  HPI  21 patient who is seen today for. He has a history of hypertension dyslipidemia and coronary artery disease.  He has done remarkable well following surgery last month for a left intertrochanteric fracture the left femur.  Admit date: 12/03/2012  Discharge date: 12/06/2012  Time spent: 35 minutes  Recommendations for Outpatient Follow-up:  1. CBC 1 week- re platelets 2. BMp 1 week Discharge Diagnoses:  Principal Problem:  Intertrochanteric fracture of left femur  Active Problems:  HYPERLIPIDEMIA  HYPERTENSION   Past Medical History  Diagnosis Date  . CORONARY ARTERY DISEASE 12/26/2006  . HYPERLIPIDEMIA 12/26/2006  . HYPERTENSION 12/26/2006  . PERIPHERAL VASCULAR DISEASE 12/26/2006    History   Social History  . Marital Status: Married    Spouse Name: N/A    Number of Children: N/A  . Years of Education: N/A   Occupational History  . Not on file.   Social History Main Topics  . Smoking status: Former Smoker -- 0.50 packs/day    Types: Cigarettes    Quit date: 06/20/1978  . Smokeless tobacco: Never Used  . Alcohol Use: 0.6 oz/week    1 Glasses of wine per week     Comment: rarely  . Drug Use: No  . Sexually Active: No   Other Topics Concern  . Not on file   Social History Narrative  . No narrative on file    Past Surgical History  Procedure Laterality Date  . Abdominal aortic aneurysm repair    . Transurethral resection of prostate    . Hernia repair      ingunial  . Cardiac catheterization    . Intramedullary (im) nail intertrochanteric Left 12/03/2012    Procedure: INTRAMEDULLARY (IM) NAIL INTERTROCHANTRIC;  Surgeon: Toni Arthurs, MD;  Location: [facility];  Service: Orthopedics;  Laterality: Left;    Family History  Problem Relation Age of Onset  . Diabetes Mother   . Hypertension Mother     Allergies  Allergen Reactions  . Cardura (Doxazosin Mesylate) Other  (See Comments)    Upset stomach with generic doxazosin only.    Current Outpatient Prescriptions on File Prior to Visit  Medication Sig Dispense Refill  . aspirin EC 325 MG EC tablet Take 1 tablet (325 mg total) by mouth daily with breakfast.  42 tablet  0  . docusate sodium 100 MG CAPS Take 100 mg by mouth 2 (two) times daily.  10 capsule  0  . doxazosin (CARDURA) 4 MG tablet Take 4 mg by mouth every morning.      . hydrochlorothiazide (HYDRODIURIL) 25 MG tablet Take 0.5 tablets (12.5 mg total) by mouth daily.  90 tablet  6  . HYDROcodone-acetaminophen (NORCO/VICODIN) 5-325 MG per tablet Take 1 tablet by mouth every 6 (six) hours as needed.  30 tablet  0  . Multiple Vitamin (MULTIVITAMIN WITH MINERALS) TABS Take 1 tablet by mouth every morning.      . senna (SENOKOT) 8.6 MG TABS Take 2 tablets (17.2 mg total) by mouth 2 (two) times daily.  120 each  0  . simvastatin (ZOCOR) 20 MG tablet Take 1 tablet (20 mg total) by mouth at bedtime.  90 tablet  6  . temazepam (RESTORIL) 15 MG capsule Take 1 capsule (15 mg total) by mouth at bedtime as needed.  90 capsule  1   No current facility-administered medications on file  prior to visit.    BP 130/80  Pulse 61  Temp(Src) 97.9 F (36.6 C) (Oral)  Resp 20  Wt 170 lb (77.111 kg)  BMI 28.29 kg/m2  SpO2 95%     Review of Systems  Constitutional: Negative for fever, chills, appetite change and fatigue.  HENT: Negative for hearing loss, ear pain, congestion, sore throat, trouble swallowing, neck stiffness, dental problem, voice change and tinnitus.   Eyes: Negative for pain, discharge and visual disturbance.  Respiratory: Negative for cough, chest tightness, wheezing and stridor.   Cardiovascular: Negative for chest pain, palpitations and leg swelling.  Gastrointestinal: Negative for nausea, vomiting, abdominal pain, diarrhea, constipation, blood in stool and abdominal distention.  Genitourinary: Negative for urgency, hematuria, flank pain,  discharge, difficulty urinating and genital sores.  Musculoskeletal: Negative for myalgias, back pain, joint swelling, arthralgias and gait problem.       Recovering from left  femoral fracture  Skin: Negative for rash.  Neurological: Negative for dizziness, syncope, speech difficulty, weakness, numbness and headaches.  Hematological: Negative for adenopathy. Does not bruise/bleed easily.  Psychiatric/Behavioral: Negative for behavioral problems and dysphoric mood. The patient is not nervous/anxious.        Objective:   Physical Exam  Constitutional: He is oriented to person, place, and time. He appears well-developed.  HENT:  Head: Normocephalic.  Right Ear: External ear normal.  Left Ear: External ear normal.  Eyes: Conjunctivae and EOM are normal.  Neck: Normal range of motion.  Cardiovascular: Normal rate and normal heart sounds.   Pulmonary/Chest: Breath sounds normal.  Abdominal: Bowel sounds are normal.  Musculoskeletal: Normal range of motion. He exhibits no edema and no tenderness.  Neurological: He is alert and oriented to person, place, and time.  Skin:  And left lateral hip incisions healing nicely  Psychiatric: He has a normal mood and affect. His behavior is normal.          Assessment & Plan:   Status post left hip fracture Hypertension stable Dyslipidemia  Continue present regimen CPX 6 months Medicines refilled

## 2013-04-04 ENCOUNTER — Other Ambulatory Visit: Payer: Self-pay | Admitting: *Deleted

## 2013-04-04 MED ORDER — HYDROCHLOROTHIAZIDE 25 MG PO TABS: 12.5000 mg | ORAL_TABLET | Freq: Every day | ORAL | Status: DC

## 2013-04-04 MED ORDER — TEMAZEPAM 15 MG PO CAPS: 15.0000 mg | ORAL_CAPSULE | Freq: Every evening | ORAL | Status: DC | PRN

## 2013-04-04 MED ORDER — SIMVASTATIN 20 MG PO TABS: 20.0000 mg | ORAL_TABLET | Freq: Every day | ORAL | Status: DC

## 2013-04-19 ENCOUNTER — Ambulatory Visit (INDEPENDENT_AMBULATORY_CARE_PROVIDER_SITE_OTHER): Payer: Medicare Other | Admitting: Internal Medicine

## 2013-04-19 ENCOUNTER — Encounter: Payer: Self-pay | Admitting: Internal Medicine

## 2013-04-19 VITALS — BP 110/70 | Temp 97.5°F | Wt 177.0 lb

## 2013-04-19 DIAGNOSIS — T148XXA Other injury of unspecified body region, initial encounter: Secondary | ICD-10-CM

## 2013-04-19 DIAGNOSIS — I10 Essential (primary) hypertension: Secondary | ICD-10-CM

## 2013-04-19 DIAGNOSIS — I2510 Atherosclerotic heart disease of native coronary artery without angina pectoris: Secondary | ICD-10-CM

## 2013-04-19 NOTE — Patient Instructions (Signed)
Call or return to clinic prn if these symptoms worsen or fail to improve as anticipated.

## 2013-04-19 NOTE — Progress Notes (Signed)
Subjective:    Patient ID: [name], male    DOB: 01/09/1929, 79 y.o.   MRN: [id number]  HPI  79 year old patient who has treated hypertension and coronary artery disease. Yesterday morning he turned to wave goodbye to his wife when he was leaving his front door and traumatized his midforehead region. He has had a small hematoma with superficial laceration.. There is no fall or loss of consciousness  Past Medical History  Diagnosis Date  . CORONARY ARTERY DISEASE 12/26/2006  . HYPERLIPIDEMIA 12/26/2006  . HYPERTENSION 12/26/2006  . PERIPHERAL VASCULAR DISEASE 12/26/2006    History   Social History  . Marital Status: Married    Spouse Name: N/A    Number of Children: N/A  . Years of Education: N/A   Occupational History  . Not on file.   Social History Main Topics  . Smoking status: Former Smoker -- 0.50 packs/day    Types: Cigarettes    Quit date: 06/20/1978  . Smokeless tobacco: Never Used  . Alcohol Use: 0.6 oz/week    1 Glasses of wine per week     Comment: rarely  . Drug Use: No  . Sexual Activity: No   Other Topics Concern  . Not on file   Social History Narrative  . No narrative on file    Past Surgical History  Procedure Laterality Date  . Abdominal aortic aneurysm repair    . Transurethral resection of prostate    . Hernia repair      ingunial  . Cardiac catheterization    . Intramedullary (im) nail intertrochanteric Left 12/03/2012    Procedure: INTRAMEDULLARY (IM) NAIL INTERTROCHANTRIC;  Surgeon: Toni Arthurs, MD;  Location: [facility];  Service: Orthopedics;  Laterality: Left;    Family History  Problem Relation Age of Onset  . Diabetes Mother   . Hypertension Mother     Allergies  Allergen Reactions  . Cardura [Doxazosin Mesylate] Other (See Comments)    Upset stomach with generic doxazosin only.    Current Outpatient Prescriptions on File Prior to Visit  Medication Sig Dispense Refill  . aspirin EC 325 MG EC tablet Take 1 tablet (325 mg total)  by mouth daily with breakfast.  42 tablet  0  . docusate sodium 100 MG CAPS Take 100 mg by mouth 2 (two) times daily.  10 capsule  0  . doxazosin (CARDURA) 4 MG tablet Take 4 mg by mouth every morning.      . hydrochlorothiazide (HYDRODIURIL) 25 MG tablet Take 0.5 tablets (12.5 mg total) by mouth daily.  90 tablet  0  . HYDROcodone-acetaminophen (NORCO/VICODIN) 5-325 MG per tablet Take 1 tablet by mouth every 6 (six) hours as needed.  30 tablet  0  . Multiple Vitamin (MULTIVITAMIN WITH MINERALS) TABS Take 1 tablet by mouth every morning.      . senna (SENOKOT) 8.6 MG TABS Take 2 tablets (17.2 mg total) by mouth 2 (two) times daily.  120 each  0  . simvastatin (ZOCOR) 20 MG tablet Take 1 tablet (20 mg total) by mouth at bedtime.  90 tablet  0  . temazepam (RESTORIL) 15 MG capsule Take 1 capsule (15 mg total) by mouth at bedtime as needed.  90 capsule  1   No current facility-administered medications on file prior to visit.    BP 110/70  Temp(Src) 97.5 F (36.4 C) (Oral)  Wt 177 lb (80.287 kg)  BMI 29.45 kg/m2       Review of Systems  Skin: Positive for wound.       Objective:   Physical Exam  Constitutional: He appears well-developed and well-nourished. No distress.  Skin:  2 cm area soft tissue swelling with a very superficial well approximated vertical laceration. No signs of infection          Assessment & Plan:  Contusion superficial laceration midforehead region area;  local wound care discussed  Hypertension stable

## 2013-04-25 ENCOUNTER — Other Ambulatory Visit: Payer: Self-pay

## 2013-05-27 ENCOUNTER — Telehealth: Payer: Self-pay | Admitting: Internal Medicine

## 2013-05-27 NOTE — Telephone Encounter (Signed)
Patient Information:  Caller Name: [name]  Phone: [phone]  Patient: [name]  Gender: Male  DOB: 08-08-1928  Age: 79 Years  PCP: Eleonore Chiquito (Family Practice > 81yrs old)  Office Follow Up:  Does the office need to follow up with this patient?: No  Instructions For The Office: N/A   Symptoms  Reason For Call & Symptoms: Pt calling regarding small elongated knot to scar above umbilicus (AAA repair 60yrs ago), after vomiting 7 x on 05/21/13 overnight. Pt is otherwise completely asymptomatic. Denies any nausea, vomiting or diarrhea. Knot is not tender to touch. . Pt is at work today. No appt availablve with Dr. Kirtland Bouchard today; pt preferst to see Dr. Kirtland Bouchard. Appt made for 11 am on 05/28/13. Strict call back paraemters given.  Reviewed Health History In EMR: Yes  Reviewed Medications In EMR: Yes  Reviewed Allergies In EMR: Yes  Reviewed Surgeries / Procedures: Yes  Date of Onset of Symptoms: 05/21/2013  Treatments Tried: Aklka-Selter, cherry  Treatments Tried Worked: No  Guideline(s) Used:  No Protocol Available - Sick Adult  Disposition Per Guideline:   See Today or Tomorrow in Office  Reason For Disposition Reached:   Nursing judgment  Advice Given:  Call Back If:  New symptoms develop  You become worse.  Patient Will Follow Care Advice:  YES  Appointment Scheduled:  05/28/2013 11:00:00 Appointment Scheduled Provider:  Eleonore Chiquito (Family Practice > 32yrs old)

## 2013-05-28 ENCOUNTER — Encounter: Payer: Self-pay | Admitting: Internal Medicine

## 2013-05-28 ENCOUNTER — Ambulatory Visit (INDEPENDENT_AMBULATORY_CARE_PROVIDER_SITE_OTHER): Payer: Medicare Other | Admitting: Internal Medicine

## 2013-05-28 VITALS — BP 128/80 | HR 57 | Temp 97.9°F | Resp 20 | Wt 171.0 lb

## 2013-05-28 DIAGNOSIS — K432 Incisional hernia without obstruction or gangrene: Secondary | ICD-10-CM

## 2013-05-28 DIAGNOSIS — I10 Essential (primary) hypertension: Secondary | ICD-10-CM

## 2013-05-28 DIAGNOSIS — I2510 Atherosclerotic heart disease of native coronary artery without angina pectoris: Secondary | ICD-10-CM

## 2013-05-28 DIAGNOSIS — E785 Hyperlipidemia, unspecified: Secondary | ICD-10-CM

## 2013-05-28 NOTE — Progress Notes (Signed)
Subjective:    Patient ID: [name], male    DOB: 12-26-1928, 79 y.o.   MRN: [id number]  HPI Pre-visit discussion using our clinic review tool. No additional management support is needed unless otherwise documented below in the visit note.  79 year old patient who has been ill for several days including nausea and vomiting. After several episodes of vomiting last week he noted a painless bulge in the periumbilical region. He has had prior abdominal surgery for AAA repair  Past Medical History  Diagnosis Date  . CORONARY ARTERY DISEASE 12/26/2006  . HYPERLIPIDEMIA 12/26/2006  . HYPERTENSION 12/26/2006  . PERIPHERAL VASCULAR DISEASE 12/26/2006    History   Social History  . Marital Status: Married    Spouse Name: N/A    Number of Children: N/A  . Years of Education: N/A   Occupational History  . Not on file.   Social History Main Topics  . Smoking status: Former Smoker -- 0.50 packs/day    Types: Cigarettes    Quit date: 06/20/1978  . Smokeless tobacco: Never Used  . Alcohol Use: 0.6 oz/week    1 Glasses of wine per week     Comment: rarely  . Drug Use: No  . Sexual Activity: No   Other Topics Concern  . Not on file   Social History Narrative  . No narrative on file    Past Surgical History  Procedure Laterality Date  . Abdominal aortic aneurysm repair    . Transurethral resection of prostate    . Hernia repair      ingunial  . Cardiac catheterization    . Intramedullary (im) nail intertrochanteric Left 12/03/2012    Procedure: INTRAMEDULLARY (IM) NAIL INTERTROCHANTRIC;  Surgeon: Toni Arthurs, MD;  Location: [facility];  Service: Orthopedics;  Laterality: Left;    Family History  Problem Relation Age of Onset  . Diabetes Mother   . Hypertension Mother     Allergies  Allergen Reactions  . Cardura [Doxazosin Mesylate] Other (See Comments)    Upset stomach with generic doxazosin only.    Current Outpatient Prescriptions on File Prior to Visit  Medication Sig  Dispense Refill  . aspirin EC 325 MG EC tablet Take 1 tablet (325 mg total) by mouth daily with breakfast.  42 tablet  0  . docusate sodium 100 MG CAPS Take 100 mg by mouth 2 (two) times daily.  10 capsule  0  . doxazosin (CARDURA) 4 MG tablet Take 4 mg by mouth every morning.      . hydrochlorothiazide (HYDRODIURIL) 25 MG tablet Take 0.5 tablets (12.5 mg total) by mouth daily.  90 tablet  0  . Multiple Vitamin (MULTIVITAMIN WITH MINERALS) TABS Take 1 tablet by mouth every morning.      . simvastatin (ZOCOR) 20 MG tablet Take 1 tablet (20 mg total) by mouth at bedtime.  90 tablet  0  . temazepam (RESTORIL) 15 MG capsule Take 1 capsule (15 mg total) by mouth at bedtime as needed.  90 capsule  1   No current facility-administered medications on file prior to visit.    BP 128/80  Pulse 57  Temp(Src) 97.9 F (36.6 C) (Oral)  Resp 20  Wt 171 lb (77.565 kg)  SpO2 98%       Review of Systems  Constitutional: Negative for fever, chills, appetite change and fatigue.  HENT: Negative for congestion, dental problem, ear pain, hearing loss, sore throat, tinnitus, trouble swallowing and voice change.   Eyes: Negative  for pain, discharge and visual disturbance.  Respiratory: Negative for cough, chest tightness, wheezing and stridor.   Cardiovascular: Negative for chest pain, palpitations and leg swelling.  Gastrointestinal: Negative for nausea, vomiting, abdominal pain, diarrhea, constipation, blood in stool and abdominal distention.       Painless periumbilical mass  Genitourinary: Negative for urgency, hematuria, flank pain, discharge, difficulty urinating and genital sores.  Musculoskeletal: Negative for arthralgias, back pain, gait problem, joint swelling, myalgias and neck stiffness.  Skin: Negative for rash.  Neurological: Negative for dizziness, syncope, speech difficulty, weakness, numbness and headaches.  Hematological: Negative for adenopathy. Does not bruise/bleed easily.    Psychiatric/Behavioral: Negative for behavioral problems and dysphoric mood. The patient is not nervous/anxious.        Objective:   Physical Exam  Constitutional: He appears well-developed and well-nourished. No distress.  Abdominal: Soft. Bowel sounds are normal.  Midline surgical scar Prominent. Peri Umbilical hernia easily reducible just to the left of the midline          Assessment & Plan:   Asymptomatic but prominent. Peri Umbilical hernia. The patient request general surgery evaluation Hypertension stable CAD stable

## 2013-05-28 NOTE — Patient Instructions (Signed)
Gen. surgery consultation as discussed Call if you develop any abdominal pain or vomiting

## 2013-05-28 NOTE — Progress Notes (Signed)
Pre-visit discussion using our clinic review tool. No additional management support is needed unless otherwise documented below in the visit note.  

## 2013-06-10 ENCOUNTER — Ambulatory Visit (INDEPENDENT_AMBULATORY_CARE_PROVIDER_SITE_OTHER): Payer: Medicare Other | Admitting: General Surgery

## 2013-06-10 ENCOUNTER — Encounter (INDEPENDENT_AMBULATORY_CARE_PROVIDER_SITE_OTHER): Payer: Self-pay | Admitting: General Surgery

## 2013-06-10 ENCOUNTER — Encounter (INDEPENDENT_AMBULATORY_CARE_PROVIDER_SITE_OTHER): Payer: Self-pay

## 2013-06-10 VITALS — BP 126/82 | HR 72 | Temp 98.2°F | Resp 14 | Ht 65.0 in | Wt 169.6 lb

## 2013-06-10 DIAGNOSIS — K432 Incisional hernia without obstruction or gangrene: Secondary | ICD-10-CM

## 2013-06-10 NOTE — Patient Instructions (Signed)
Incisional hernia repair Laparoscopic or open method

## 2013-06-10 NOTE — Progress Notes (Signed)
Patient ID: [name], male   DOB: [birth year], 79 y.o.   MRN: [id number]  Chief Complaint  Patient presents with  . New Evaluation    eval umb hernia    HPI [name] is a 79 y.o. male.  Referred by Dr Amador Cunas HPI This is an 79 year old male who presents with an abdominal wall hernia. He had some flu symptoms recently with multiple episodes of emesis that caused him to go to his primary care physician to be evaluated as he was concerned about his prior aneurysm repair. He has a history of a AAA repair about 9 years ago. He does not report any abdominal pain. He has only the one episode of nausea and vomiting. He noticed a bulge above his umbilicus. This does not really have any symptoms associated with it at all. He was then diagnosed with an incisional hernia and referred for evaluation. 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 He cares for his wife to some degree. Past Medical History  Diagnosis Date  . CORONARY ARTERY DISEASE 12/26/2006  . HYPERLIPIDEMIA 12/26/2006  . HYPERTENSION 12/26/2006  . PERIPHERAL VASCULAR DISEASE 12/26/2006    Past Surgical History  Procedure Laterality Date  . Abdominal aortic aneurysm repair    . Transurethral resection of prostate    . Hernia repair      ingunial  . Cardiac catheterization    . Intramedullary (im) nail intertrochanteric Left 12/03/2012    Procedure: INTRAMEDULLARY (IM) NAIL INTERTROCHANTRIC;  Surgeon: Toni Arthurs, MD;  Location: [facility];  Service: Orthopedics;  Laterality: Left;    Family History  Problem Relation Age of Onset  . Diabetes Mother   . Hypertension Mother     Social History History  Substance Use Topics  . Smoking status: Former Smoker -- 0.50 packs/day    Types: Cigarettes    Quit date: 06/20/1978  . Smokeless tobacco: Never Used  . Alcohol Use: 0.6 oz/week    1 Glasses of wine per week     Comment: rarely    Allergies  Allergen Reactions  . Cardura [Doxazosin Mesylate] Other (See Comments)      Upset stomach with generic doxazosin only.    Current Outpatient Prescriptions  Medication Sig Dispense Refill  . aspirin EC 325 MG EC tablet Take 1 tablet (325 mg total) by mouth daily with breakfast.  42 tablet  0  . docusate sodium 100 MG CAPS Take 100 mg by mouth 2 (two) times daily.  10 capsule  0  . doxazosin (CARDURA) 4 MG tablet Take 4 mg by mouth every morning.      . hydrochlorothiazide (HYDRODIURIL) 25 MG tablet Take 0.5 tablets (12.5 mg total) by mouth daily.  90 tablet  0  . Multiple Vitamin (MULTIVITAMIN WITH MINERALS) TABS Take 1 tablet by mouth every morning.      . simvastatin (ZOCOR) 20 MG tablet Take 1 tablet (20 mg total) by mouth at bedtime.  90 tablet  0  . temazepam (RESTORIL) 15 MG capsule Take 1 capsule (15 mg total) by mouth at bedtime as needed.  90 capsule  1   No current facility-administered medications for this visit.    Review of Systems Review of Systems  Blood pressure 126/82, pulse 72, temperature 98.2 F (36.8 C), temperature source Temporal, resp. rate 14, height 5\' 5"  (1.651 m), weight 169 lb 9.6 oz (76.93 kg).  Physical Exam Physical Exam  Vitals reviewed. Constitutional: He appears well-developed and well-nourished.  Eyes:  No scleral icterus.  Cardiovascular: Normal rate, regular rhythm and normal heart sounds.   Pulmonary/Chest: Effort normal and breath sounds normal. He has no wheezes. He has no rales.  Abdominal: Soft. Bowel sounds are normal.    Lymphadenopathy:    He has no cervical adenopathy.    Data Reviewed Notes from Dr Amador Cunas  Assessment    Incisional hernia    Plan    We had a long discussion today about what is an incisional hernia. This was related to his prior surgery and certainly his aneurysm put him at higher risk. He thought that this was going to be an umbilical hernia and obtained a fair amount of information about what that repair would be. I told him this would not be a same day surgery at all and  this procedure was not like that.We discussed the indication for repair which would be an enlarging symptomatic hernia as well as prevention of any urgent operation. We did also discuss observation as a possibility. There certainly is some risk to any of these choices. We discussed both the laparoscopic and open incisional hernia repair with mesh. We discussed the rationale for a mesh repair as well as the pros and cons of both of those procedures today. I think that the best plan would be for him to go home and think about this little bit. I wrote down the names of the procedure so he can investigate this little bit more. He is due to see his primary care physician on January 19 to discuss this. I told him I would plan to see him back soon after that to reevaluate him. I think will be good for his wife to this visit is well.        Crayton Savarese 06/10/2013, 3:45 PM

## 2013-06-14 ENCOUNTER — Encounter: Payer: Medicare Other | Admitting: Internal Medicine

## 2013-06-18 ENCOUNTER — Other Ambulatory Visit: Payer: Self-pay | Admitting: Internal Medicine

## 2013-06-21 ENCOUNTER — Telehealth: Payer: Self-pay | Admitting: Internal Medicine

## 2013-06-21 NOTE — Telephone Encounter (Signed)
Pt requesting refill of the following meds sent to [facility]:  hydrochlorothiazide (HYDRODIURIL) 25 MG tablet  temazepam (RESTORIL) 15 MG capsule  Pt states he does not need a refill of the following meds and if you get a request from the pharmacy please disregard:  simvastatin (ZOCOR) 20 MG tablet   metoprolol (LOPRESSOR) 100 MG tablet

## 2013-06-22 MED ORDER — HYDROCHLOROTHIAZIDE 25 MG PO TABS: 12.5000 mg | ORAL_TABLET | Freq: Every day | ORAL | Status: DC

## 2013-06-24 MED ORDER — TEMAZEPAM 15 MG PO CAPS: 15.0000 mg | ORAL_CAPSULE | Freq: Every evening | ORAL | Status: DC | PRN

## 2013-06-24 NOTE — Telephone Encounter (Signed)
Spoke to pt told him Rx refills done as requested. Pt verbalized understanding. Rx for Temazepam faxed to [facility].

## 2013-06-27 ENCOUNTER — Other Ambulatory Visit: Payer: Self-pay | Admitting: Dermatology

## 2013-07-08 ENCOUNTER — Ambulatory Visit (INDEPENDENT_AMBULATORY_CARE_PROVIDER_SITE_OTHER): Payer: Medicare Other | Admitting: Internal Medicine

## 2013-07-08 ENCOUNTER — Encounter: Payer: Self-pay | Admitting: Internal Medicine

## 2013-07-08 VITALS — BP 100/60 | HR 65 | Temp 97.5°F | Resp 20 | Ht 65.0 in | Wt 168.0 lb

## 2013-07-08 DIAGNOSIS — I2510 Atherosclerotic heart disease of native coronary artery without angina pectoris: Secondary | ICD-10-CM

## 2013-07-08 DIAGNOSIS — E785 Hyperlipidemia, unspecified: Secondary | ICD-10-CM

## 2013-07-08 DIAGNOSIS — I739 Peripheral vascular disease, unspecified: Secondary | ICD-10-CM

## 2013-07-08 DIAGNOSIS — Z0000 Encounter for general adult medical examination without abnormal findings: Secondary | ICD-10-CM

## 2013-07-08 DIAGNOSIS — I10 Essential (primary) hypertension: Secondary | ICD-10-CM

## 2013-07-08 LAB — COMPREHENSIVE METABOLIC PANEL
ALBUMIN: 3.9 g/dL (ref 3.5–5.2)
ALT: 14 U/L (ref 0–53)
AST: 18 U/L (ref 0–37)
Alkaline Phosphatase: 70 U/L (ref 39–117)
BUN: 23 mg/dL (ref 6–23)
CALCIUM: 9.1 mg/dL (ref 8.4–10.5)
CHLORIDE: 101 meq/L (ref 96–112)
CO2: 31 meq/L (ref 19–32)
Creatinine, Ser: 1.1 mg/dL (ref 0.4–1.5)
GFR: 65.2 mL/min (ref >60.00)
Glucose, Bld: 94 mg/dL (ref 70–99)
Potassium: 3.2 mEq/L — ABNORMAL LOW (ref 3.5–5.1)
Sodium: 140 mEq/L (ref 135–145)
Total Bilirubin: 0.8 mg/dL (ref 0.3–1.2)
Total Protein: 6.9 g/dL (ref 6.0–8.3)

## 2013-07-08 LAB — LIPID PANEL
CHOL/HDL RATIO: 2
CHOLESTEROL: 109 mg/dL (ref 0–200)
HDL: 44.1 mg/dL (ref >39.00)
LDL Cholesterol: 44 mg/dL (ref 0–99)
Triglycerides: 103 mg/dL (ref 0.0–149.0)
VLDL: 20.6 mg/dL (ref 0.0–40.0)

## 2013-07-08 LAB — CBC WITH DIFFERENTIAL/PLATELET
BASOS ABS: 0 10*3/uL (ref 0.0–0.1)
Basophils Relative: 0.1 % (ref 0.0–3.0)
EOS ABS: 0.1 10*3/uL (ref 0.0–0.7)
Eosinophils Relative: 1.6 % (ref 0.0–5.0)
HCT: 46 % (ref 39.0–52.0)
Hemoglobin: 15.6 g/dL (ref 13.0–17.0)
LYMPHS PCT: 14 % (ref 12.0–46.0)
Lymphs Abs: 1.3 10*3/uL (ref 0.7–4.0)
MCHC: 34 g/dL (ref 30.0–36.0)
MCV: 87.6 fl (ref 78.0–100.0)
MONOS PCT: 9.5 % (ref 3.0–12.0)
Monocytes Absolute: 0.9 10*3/uL (ref 0.1–1.0)
NEUTROS PCT: 74.8 % (ref 43.0–77.0)
Neutro Abs: 6.7 10*3/uL (ref 1.4–7.7)
Platelets: 175 10*3/uL (ref 150.0–400.0)
RBC: 5.25 Mil/uL (ref 4.22–5.81)
RDW: 13.7 % (ref 11.5–14.6)
WBC: 9 10*3/uL (ref 4.5–10.5)

## 2013-07-08 LAB — TSH: TSH: 1.79 u[IU]/mL (ref 0.35–5.50)

## 2013-07-08 NOTE — Progress Notes (Signed)
Pre-visit discussion using our clinic review tool. No additional management support is needed unless otherwise documented below in the visit note.  

## 2013-07-08 NOTE — Progress Notes (Signed)
Subjective:    Patient ID: [name], male    DOB: March 03, 1929, 80 y.o.   MRN: [id number]  HPI  Patient ID: [name], male   DOB: [birth year], 80 y.o.   MRN: [id number]  Subjective:    Patient ID: [name], male    DOB: 01-02-1929, 80 y.o.   MRN: [id number]  HPI  History of Present Illness:   80   year-old patient who is seen today for a wellness exam. Medical problems include dyslipidemia, peripheral vascular disease, hypertension, and coronary artery disease. He is stable without any cardiopulmonary complaints. Medical regimen includes simvastatin, which he tolerates well.  Here for Medicare AWV:   1. Risk factors based on Past M, S, F history: patient has known coronary artery disease. Risk factors include peripheral vascular disease, hypertension, and dyslipidemia  2. Physical Activities: remains very active. Continues to play golf occasionally and exercises daily 3. Depression/mood: history depression, or mood disorder  4. Hearing: wears a hearing aid bilaterally  5. ADL's: independent in all aspects of daily living  6. Fall Risk: low  7. Home Safety: no problems identified  8. Height, weight, &visual acuity:height and weight are stable. No change in visual acuity  9. Counseling: heart healthy diet more regular exercise and modest weight loss. All encouraged  10. Labs ordered based on risk factors: laboratory profile, including lipid panel will be reviewed  11. Referral Coordination- not appropriate at this time  12. Care Plan- heart healthy diet, exercise, weight loss encouraged  13. Cognitive Assessment- alert and oriented, with normal affect. No cognitive dysfunction. handles all executive functions in the household   Allergies (verified):  No Known Drug Allergies   Past History:  Past Medical History:    Coronary artery disease history 80% circumflex lesion via catheterizatio 2005  Hypertension  Peripheral vascular disease  Hyperlipidemia   Past  Surgical History:   Abdominal aortic aneurysm repair September 2005  Transurethral resection of prostate  Inguinal herniorrhaphy  cardiac catheterization, September 2005   Family History:   both parents died at age 26 mother history hypertension, and diabetes  two younger brothers are in good health   Social History:  Reviewed history from 05/27/2008 and no changes required.  Married  active with golf    Review of Systems  Constitutional: Negative for fever, chills, activity change, appetite change and fatigue.  HENT: Positive for hearing loss. Negative for ear pain, congestion, rhinorrhea, sneezing, mouth sores, trouble swallowing, neck pain, neck stiffness, dental problem, voice change, sinus pressure and tinnitus.   Eyes: Negative for photophobia, pain, redness and visual disturbance.  Respiratory: Negative for apnea, cough, choking, chest tightness, shortness of breath and wheezing.   Cardiovascular: Negative for chest pain, palpitations and leg swelling.  Gastrointestinal: Negative for nausea, vomiting, abdominal pain, diarrhea, constipation, blood in stool, abdominal distention, anal bleeding and rectal pain.  Genitourinary: Negative for dysuria, urgency, frequency, hematuria, flank pain, decreased urine volume, discharge, penile swelling, scrotal swelling, difficulty urinating, genital sores and testicular pain.  Musculoskeletal: Negative for myalgias, back pain, joint swelling, arthralgias and gait problem.  Skin: Negative for color change, rash and wound.  Neurological: Negative for dizziness, tremors, seizures, syncope, facial asymmetry, speech difficulty, weakness, light-headedness, numbness and headaches.  Hematological: Negative for adenopathy. Does not bruise/bleed easily.  Psychiatric/Behavioral: Negative for suicidal ideas, hallucinations, behavioral problems, confusion, sleep disturbance, self-injury, dysphoric mood, decreased concentration and agitation. The patient is  not nervous/anxious.        Objective:  Physical Exam  Constitutional: He appears well-developed and well-nourished.  HENT:  Head: Normocephalic and atraumatic.  Right Ear: External ear normal.  Left Ear: External ear normal.  Nose: Nose normal.  Mouth/Throat: Oropharynx is clear and moist.        Hearing aid present  bilaterally  Eyes: EOM are normal. Pupils are equal, round, and reactive to light. No scleral icterus.   Neck: Normal range of motion. Neck supple. No JVD present. No thyromegaly present.  Cardiovascular: Regular rhythm and normal heart sounds.  Exam reveals no gallop and no friction rub.   No murmur heard.      Pedal pulses absent except for an intact left posterior tibial pulse  Pulmonary/Chest: Effort normal and breath sounds normal. He exhibits no tenderness.  Abdominal: Soft. Bowel sounds are normal. He exhibits no distension and no mass. There is no tenderness.       Midline surgical scar  prominent periumbilical hernia  Genitourinary: Rectum normal, prostate normal and penis normal. Guaiac negative stool.       Status post prostatectomy  Musculoskeletal: Normal range of motion. He exhibits no edema and no tenderness.  Lymphadenopathy:    He has no cervical adenopathy.  Neurological: He is alert. He has normal reflexes. No cranial nerve deficit. Coordination normal.  Skin: Skin is warm and dry. No rash noted.  Psychiatric: He has a normal mood and affect. His behavior is normal.          Assessment & Plan:   Preventive health examination Peripheral vascular disease Status post AAA repair Dyslipidemia Hypertension. We'll discontinue hydrochlorothiazide and  We'll continue present regimen. This includes aspirin and statin therapy Laboratory studies will be reviewed  BP Readings from Last 3 Encounters:  07/08/13 100/60  06/10/13 126/82  05/28/13 128/80    Review of Systems    as above Objective:   Physical Exam  As above        Assessment & Plan:    As above

## 2013-07-08 NOTE — Patient Instructions (Addendum)
Limit your sodium (Salt) intake    It is important that you exercise regularly, at least 20 minutes 3 to 4 times per week.  If you develop chest pain or shortness of breath seek  medical attention.  Return in 6 months for follow-up  Please check your blood pressure on a regular basis.  If it is consistently greater than 150/90, please make an office appointment.  Discontinue hydrochlorothiazide

## 2013-07-12 ENCOUNTER — Encounter (INDEPENDENT_AMBULATORY_CARE_PROVIDER_SITE_OTHER): Payer: Self-pay | Admitting: General Surgery

## 2013-07-12 ENCOUNTER — Ambulatory Visit (INDEPENDENT_AMBULATORY_CARE_PROVIDER_SITE_OTHER): Payer: Medicare Other | Admitting: General Surgery

## 2013-07-12 VITALS — BP 128/76 | HR 60 | Temp 98.0°F | Resp 18 | Ht 68.0 in | Wt 171.0 lb

## 2013-07-12 DIAGNOSIS — K432 Incisional hernia without obstruction or gangrene: Secondary | ICD-10-CM

## 2013-07-15 NOTE — Progress Notes (Signed)
Subjective:     Patient ID: [name], male   DOB: 1928/10/03, 80 y.o.   MRN: [id number]  HPI This is an 80 year old male who presented previously with an abdominal wall hernia.He does not report any abdominal pain. He has no issues since our last visit.  At last visit he was very surprised of the magnitude of operation as he thought was going to be simple umbilical hernia repair as he had read about.  This is incisional hernia though with history of AAA.  His bulge is still present and he wanted to return today to discuss again.   Review of Systems     Objective:   Physical Exam Abdominal: Soft. Bowel sounds are normal.         Assessment:     Incisional hernia    Plan:     We had another discussion again today about what is an incisional hernia. This was related to his prior surgery and certainly his aneurysm put him at higher risk. We discussed the indication for repair which would be an enlarging symptomatic hernia as well as prevention of any urgent operation. We did also discuss observation as a possibility. There certainly is some risk to any of these choices. We discussed both the laparoscopic and open incisional hernia repair with mesh. We discussed the rationale for a mesh repair as well as the pros and cons of both of those procedures today again as we did before.  He understands all of this and wants me to just tell him what he should do.  I think the standard recommendation would be to repair this but he is hesitant. I think observation is ok and he is going to come back and see me in 3 months.  We discussed there is a risk of emergency surgery with this also but I think this is fairly low.

## 2013-07-24 ENCOUNTER — Telehealth: Payer: Self-pay | Admitting: Internal Medicine

## 2013-07-24 NOTE — Telephone Encounter (Signed)
Relevant patient education assigned to patient using Emmi. ° °

## 2013-08-07 ENCOUNTER — Encounter: Payer: Self-pay | Admitting: Internal Medicine

## 2013-08-07 ENCOUNTER — Ambulatory Visit (INDEPENDENT_AMBULATORY_CARE_PROVIDER_SITE_OTHER): Payer: Medicare Other | Admitting: Internal Medicine

## 2013-08-07 VITALS — BP 134/80 | HR 60 | Temp 97.4°F | Resp 20 | Ht 65.0 in | Wt 173.0 lb

## 2013-08-07 DIAGNOSIS — I2510 Atherosclerotic heart disease of native coronary artery without angina pectoris: Secondary | ICD-10-CM

## 2013-08-07 DIAGNOSIS — I10 Essential (primary) hypertension: Secondary | ICD-10-CM

## 2013-08-07 DIAGNOSIS — J069 Acute upper respiratory infection, unspecified: Secondary | ICD-10-CM

## 2013-08-07 DIAGNOSIS — B9789 Other viral agents as the cause of diseases classified elsewhere: Secondary | ICD-10-CM

## 2013-08-07 NOTE — Patient Instructions (Signed)
Acute bronchitis symptoms for less than 10 days are generally not helped by antibiotics.  Take over-the-counter expectorants and cough medications such as  Mucinex DM.  Call if there is no improvement in 5 to 7 days or if he developed worsening cough, fever, or new symptoms, such as shortness of breath or chest pain.    

## 2013-08-07 NOTE — Progress Notes (Signed)
Subjective:    Patient ID: [name], male    DOB: December 23, 1928, 80 y.o.   MRN: [id number]  HPI   80 year old patient who has a history of coronary artery disease, as well as hypertension.  He presents with a several day history of minimal cough, and chest congestion.  No fever or productive cough.  Denies any wheezing or shortness of breath.  No fever or chills.  Was active, playing golf last week.  His cardiac status has been stable.  He has been compliant with his medications  Past Medical History  Diagnosis Date  . CORONARY ARTERY DISEASE 12/26/2006  . HYPERLIPIDEMIA 12/26/2006  . HYPERTENSION 12/26/2006  . PERIPHERAL VASCULAR DISEASE 12/26/2006    History   Social History  . Marital Status: Married    Spouse Name: N/A    Number of Children: N/A  . Years of Education: N/A   Occupational History  . Not on file.   Social History Main Topics  . Smoking status: Former Smoker -- 0.50 packs/day    Types: Cigarettes    Quit date: 06/20/1978  . Smokeless tobacco: Never Used  . Alcohol Use: 0.6 oz/week    1 Glasses of wine per week     Comment: rarely  . Drug Use: No  . Sexual Activity: No   Other Topics Concern  . Not on file   Social History Narrative  . No narrative on file    Past Surgical History  Procedure Laterality Date  . Abdominal aortic aneurysm repair    . Transurethral resection of prostate    . Hernia repair      ingunial  . Cardiac catheterization    . Intramedullary (im) nail intertrochanteric Left 12/03/2012    Procedure: INTRAMEDULLARY (IM) NAIL INTERTROCHANTRIC;  Surgeon: Wylene Simmer, MD;  Location: [facility];  Service: Orthopedics;  Laterality: Left;    Family History  Problem Relation Age of Onset  . Diabetes Mother   . Hypertension Mother     Allergies  Allergen Reactions  . Cardura [Doxazosin Mesylate] Other (See Comments)    Upset stomach with generic doxazosin only.    Current Outpatient Prescriptions on File Prior to Visit  Medication  Sig Dispense Refill  . aspirin EC 325 MG EC tablet Take 1 tablet (325 mg total) by mouth daily with breakfast.  42 tablet  0  . doxazosin (CARDURA) 4 MG tablet Take 4 mg by mouth every morning.      . metoprolol (LOPRESSOR) 100 MG tablet TAKE ONE-HALF (1/2) TABLET (50 MG) TWICE A DAY  90 tablet  1  . Multiple Vitamin (MULTIVITAMIN WITH MINERALS) TABS Take 1 tablet by mouth every morning.      . simvastatin (ZOCOR) 20 MG tablet TAKE 1 TABLET DAILY AT BEDTIME  90 tablet  1  . temazepam (RESTORIL) 15 MG capsule Take 1 capsule (15 mg total) by mouth at bedtime as needed.  90 capsule  1   No current facility-administered medications on file prior to visit.    BP 134/80  Pulse 60  Temp(Src) 97.4 F (36.3 C) (Oral)  Resp 20  Ht 5\' 5"  (1.651 m)  Wt 173 lb (78.472 kg)  BMI 28.79 kg/m2  SpO2 98%       Review of Systems  Constitutional: Positive for activity change and appetite change. Negative for fever, chills and fatigue.  HENT: Negative for congestion, dental problem, ear pain, hearing loss, sore throat, tinnitus, trouble swallowing and voice change.   Eyes:  Negative for pain, discharge and visual disturbance.  Respiratory: Positive for cough. Negative for chest tightness, wheezing and stridor.   Cardiovascular: Negative for chest pain, palpitations and leg swelling.  Gastrointestinal: Negative for nausea, vomiting, abdominal pain, diarrhea, constipation, blood in stool and abdominal distention.  Genitourinary: Negative for urgency, hematuria, flank pain, discharge, difficulty urinating and genital sores.  Musculoskeletal: Negative for arthralgias, back pain, gait problem, joint swelling, myalgias and neck stiffness.  Skin: Negative for rash.  Neurological: Negative for dizziness, syncope, speech difficulty, weakness, numbness and headaches.  Hematological: Negative for adenopathy. Does not bruise/bleed easily.  Psychiatric/Behavioral: Negative for behavioral problems and dysphoric  mood. The patient is not nervous/anxious.        Objective:   Physical Exam  Constitutional: He is oriented to person, place, and time. He appears well-developed and well-nourished. No distress.  Afebrile Blood pressure 130/80 Pulse rate 60 O2 saturation 98%  HENT:  Head: Normocephalic.  Right Ear: External ear normal.  Left Ear: External ear normal.  Eyes: Conjunctivae and EOM are normal.  Neck: Normal range of motion.  Cardiovascular: Normal rate and normal heart sounds.   Pulmonary/Chest: Effort normal and breath sounds normal. No respiratory distress. He has no wheezes. He has no rales.  Abdominal: Bowel sounds are normal.  Musculoskeletal: Normal range of motion. He exhibits no edema and no tenderness.  Neurological: He is alert and oriented to person, place, and time.  Psychiatric: He has a normal mood and affect. His behavior is normal.          Assessment & Plan:   Mild viral URI.  Will treat symptomatically with Mucinex DM.  Force fluids Hypertension stable CAD stable   Recheck as scheduled

## 2013-08-07 NOTE — Progress Notes (Signed)
Pre-visit discussion using our clinic review tool. No additional management support is needed unless otherwise documented below in the visit note.  

## 2013-08-09 ENCOUNTER — Telehealth (INDEPENDENT_AMBULATORY_CARE_PROVIDER_SITE_OTHER): Payer: Self-pay

## 2013-08-09 ENCOUNTER — Telehealth: Payer: Self-pay | Admitting: Internal Medicine

## 2013-08-09 NOTE — Telephone Encounter (Signed)
Pt would like a referral to orthopedic for thigh pain. Pt has medicare primary ins. Pt would like a orth appt today

## 2013-08-09 NOTE — Telephone Encounter (Signed)
Please advise 

## 2013-08-09 NOTE — Telephone Encounter (Signed)
Pt got an appt at [facility] today  They advised pt he did not require a referral. So  Disregard request

## 2013-08-09 NOTE — Telephone Encounter (Signed)
Patient is having right thigh pain and is wanting to know if this pain could be related to hernia. Please advise

## 2013-08-11 NOTE — Telephone Encounter (Signed)
Not from his incisional hernia.

## 2013-08-12 ENCOUNTER — Telehealth (INDEPENDENT_AMBULATORY_CARE_PROVIDER_SITE_OTHER): Payer: Self-pay

## 2013-08-12 NOTE — Telephone Encounter (Signed)
Informed patient hernia does cause thigh pain , asking to be seen in March. appt 08-28-13@11 :10 am

## 2013-08-20 ENCOUNTER — Ambulatory Visit (INDEPENDENT_AMBULATORY_CARE_PROVIDER_SITE_OTHER): Payer: Medicare Other | Admitting: Family Medicine

## 2013-08-20 ENCOUNTER — Encounter: Payer: Self-pay | Admitting: Family Medicine

## 2013-08-20 VITALS — BP 110/78 | HR 71 | Temp 98.6°F | Wt 173.0 lb

## 2013-08-20 DIAGNOSIS — I2510 Atherosclerotic heart disease of native coronary artery without angina pectoris: Secondary | ICD-10-CM

## 2013-08-20 DIAGNOSIS — R609 Edema, unspecified: Secondary | ICD-10-CM

## 2013-08-20 DIAGNOSIS — R600 Localized edema: Secondary | ICD-10-CM

## 2013-08-20 MED ORDER — FUROSEMIDE 20 MG PO TABS: 20.0000 mg | ORAL_TABLET | Freq: Every day | ORAL | Status: DC

## 2013-08-20 NOTE — Progress Notes (Signed)
Pre visit review using our clinic review tool, if applicable. No additional management support is needed unless otherwise documented below in the visit note. 

## 2013-08-20 NOTE — Progress Notes (Signed)
Chief Complaint  Patient presents with  . Edema    legs, feet, and ankles     HPI:  [name] is an 80 yo M pt of Dr. Burnice Logan here for and acute visit for ankle edema: -had ham yesterday, lunch meat -has happened on and off for a long time, does not want to use compression -notices more when wears socks with tight band which he has been wearing more lately -reports PCP stopped diuretic recently because BP good -denies: CP, SOB, wheezing, fatigue, palptiations, orthopnea    ROS: See pertinent positives and negatives per HPI.  Past Medical History  Diagnosis Date  . CORONARY ARTERY DISEASE 12/26/2006  . HYPERLIPIDEMIA 12/26/2006  . HYPERTENSION 12/26/2006  . PERIPHERAL VASCULAR DISEASE 12/26/2006    Past Surgical History  Procedure Laterality Date  . Abdominal aortic aneurysm repair    . Transurethral resection of prostate    . Hernia repair      ingunial  . Cardiac catheterization    . Intramedullary (im) nail intertrochanteric Left 12/03/2012    Procedure: INTRAMEDULLARY (IM) NAIL INTERTROCHANTRIC;  Surgeon: Wylene Simmer, MD;  Location: [facility];  Service: Orthopedics;  Laterality: Left;    Family History  Problem Relation Age of Onset  . Diabetes Mother   . Hypertension Mother     History   Social History  . Marital Status: Married    Spouse Name: N/A    Number of Children: N/A  . Years of Education: N/A   Social History Main Topics  . Smoking status: Former Smoker -- 0.50 packs/day    Types: Cigarettes    Quit date: 06/20/1978  . Smokeless tobacco: Never Used  . Alcohol Use: 0.6 oz/week    1 Glasses of wine per week     Comment: rarely  . Drug Use: No  . Sexual Activity: No   Other Topics Concern  . None   Social History Narrative  . None    Current outpatient prescriptions:aspirin EC 325 MG EC tablet, Take 1 tablet (325 mg total) by mouth daily with breakfast., Disp: 42 tablet, Rfl: 0;  doxazosin (CARDURA) 4 MG tablet, Take 4 mg by mouth every  morning., Disp: , Rfl: ;  metoprolol (LOPRESSOR) 100 MG tablet, TAKE ONE-HALF (1/2) TABLET (50 MG) TWICE A DAY, Disp: 90 tablet, Rfl: 1;  Multiple Vitamin (MULTIVITAMIN WITH MINERALS) TABS, Take 1 tablet by mouth every morning., Disp: , Rfl:  simvastatin (ZOCOR) 20 MG tablet, TAKE 1 TABLET DAILY AT BEDTIME, Disp: 90 tablet, Rfl: 1;  temazepam (RESTORIL) 15 MG capsule, Take 1 capsule (15 mg total) by mouth at bedtime as needed., Disp: 90 capsule, Rfl: 1;  furosemide (LASIX) 20 MG tablet, Take 1 tablet (20 mg total) by mouth daily., Disp: 20 tablet, Rfl: 0  EXAM:  Filed Vitals:   08/20/13 1352  BP: 110/78  Pulse: 71  Temp: 98.6 F (37 C)    Body mass index is 28.79 kg/(m^2).  GENERAL: vitals reviewed and listed above, alert, oriented, appears well hydrated and in no acute distress  HEENT: atraumatic, conjunttiva clear, no obvious abnormalities on inspection of external nose and ears  NECK: no obvious masses on inspection  LUNGS: clear to auscultation bilaterally, no wheezes, rales or rhonchi, good air movement  CV: HRRR, tr bilat pitting edema ankles and lower leg, skin changes c/w chronic venous insufficiency  MS: moves all extremities without noticeable abnormality  PSYCH: pleasant and cooperative, no obvious depression or anxiety  ASSESSMENT AND PLAN:  Discussed the following assessment and plan:  Edema, lower extremity - Plan: furosemide (LASIX) 20 MG tablet  -we discussed possible serious and likely etiologies, workup and treatment, treatment risks and return precautions -likely chronic venous insufficiency; discuss other causes of LE edema but less likely given hx and exam - likely worsened since stopping diuretic -after this discussion, [name] opted for low sodium, lasix low dose for a few days, elevation - not interested in compression -follow up advised with PCP in 3-4 weeks  -of course, we advised [name]  to return or notify a doctor immediately if symptoms worsen or  persist or new concerns arise.  -Patient advised to return or notify a doctor immediately if symptoms worsen or persist or new concerns arise.  Patient Instructions  -take the lasix in the morning once daily for 3 days then stop  -compression stockings if you tolerate  -elevate feet for 30 minute daily  -low sodium diet  -follow up with Dr. Raliegh Ip in 3-4 months or see a doctor immediately if chest pain, palpitations, increased swelling, trouble breathing or other concerns     [name], [name] R.

## 2013-08-20 NOTE — Patient Instructions (Signed)
-  take the lasix in the morning once daily for 3 days then stop  -compression stockings if you tolerate  -elevate feet for 30 minute daily  -low sodium diet  -follow up with Dr. Raliegh Ip in 3-4 months or see a doctor immediately if chest pain, palpitations, increased swelling, trouble breathing or other concerns

## 2013-08-26 ENCOUNTER — Ambulatory Visit
Admission: RE | Admit: 2013-08-26 | Discharge: 2013-08-26 | Disposition: A | Discharge status: Home / Self Care | Payer: Medicare Other | Source: Ambulatory Visit | Attending: Sports Medicine | Admitting: Sports Medicine

## 2013-08-26 ENCOUNTER — Telehealth: Payer: Self-pay | Admitting: Internal Medicine

## 2013-08-26 ENCOUNTER — Other Ambulatory Visit: Payer: Self-pay | Admitting: Sports Medicine

## 2013-08-26 DIAGNOSIS — M25559 Pain in unspecified hip: Secondary | ICD-10-CM

## 2013-08-26 NOTE — Telephone Encounter (Signed)
Called and spoke with daughter and she states the lasix has helped some.  The right ankle (improved)  it has helped but he still has some on left ankle.  Pt had an MRI today ordered by ortho for stress fracture- waiting on results.  Pt does not have compression stockings.  Pt's daughter aware of recommendations.  Pt's daughter does not think it is getting worse but the left side is still swelling.  Should pt continue lasix?

## 2013-08-26 NOTE — Telephone Encounter (Signed)
He had swelling on both sides when I saw him. Did the lasix and elevation help? Compression socks also can help. If one side should order LE [place] to exclude DVT on that side. If worsening needs to follow up.

## 2013-08-26 NOTE — Telephone Encounter (Signed)
Called and spoke with  [facility] and pt and was advised that the left ankle is still swelling.  Per pt it is not bigger than when pt was in at the appointment.  Per pt no redness or pain.  Pt is still taking the lasix but did not take it today ( in all pt took this for 5 days).  The swelling is not getting worse- how long should pt wait before returning for follow up.    Per Dr. Maudie Mercury pt to elevated legs, and continue lasix for 3 more days and get compression stockings.  Pt is aware of recommendations.

## 2013-08-26 NOTE — Telephone Encounter (Signed)
Pt saw Dr. Maudie Mercury last week for swelling and he is having swelling in right ankle and swelling left leg. He has pain in his groin area, he is having a MRI later on today.  Please advise a f/u plan of care.

## 2013-08-28 ENCOUNTER — Encounter (INDEPENDENT_AMBULATORY_CARE_PROVIDER_SITE_OTHER): Payer: Medicare Other | Admitting: General Surgery

## 2013-09-02 ENCOUNTER — Telehealth: Payer: Self-pay | Admitting: Internal Medicine

## 2013-09-02 NOTE — Telephone Encounter (Signed)
Needs to follow up with his PCP. This looked like a chronic mild problem in both legs when I saw him that would typically be treated with daily compression socks and elevation and occ use of lasix when worsened. If he continues to have issues he needs to see Dr. Burnice Logan. The HCTZ was a chronic med for him stopped due to BP being good per their report if I recall - if his BP is ok he can discuss continuing this with his PCP. Thanks.

## 2013-09-02 NOTE — Telephone Encounter (Signed)
Called and spoke with pt's daughter and she is aware of Dr. Julianne Rice recommendations. Pt's daughter states she will keep appointment on 3.24.2015

## 2013-09-02 NOTE — Telephone Encounter (Signed)
Patient Information:  Caller Name: Arville Go  Phone: [phone]  Patient: [name], [name]  Gender: Male  DOB: Jul 27, 1928  Age: 80 Years  PCP: Bluford Kaufmann (Family Practice > 86yrs old)  Office Follow Up:  Does the office need to follow up with this patient?: Yes  Instructions For The Office: Please advise   Symptoms  Reason For Call & Symptoms: Daughter calling stating pt's lower bilateral extretmity edema is not better, but no worse. Left may be slightly better with compression socks but "hard to tell". Denies any other sx such as: SOB, trouble breathing, palpatations or chest pain. Does have some chest congestion. Daughter asking for more definite instructions, such as: should pt keep on taking HCTZ (was only ordered for 3 dys per Dr. Maudie Mercury on 08/20/13)? Should he be started on Lasix again? Should edema be better by now with comp socks? Does MD want to see him again before the 3-4 month mark noted in Epic, b/c he is not improved?. Please advise.  Reviewed Health History In EMR: Yes  Reviewed Medications In EMR: Yes  Reviewed Allergies In EMR: Yes  Reviewed Surgeries / Procedures: Yes  Date of Onset of Symptoms: 08/20/2013  Treatments Tried: Compression socks, Elevation, HCTZ and low NA  Treatments Tried Worked: No  Guideline(s) Used:  Leg Swelling and Edema  Disposition Per Guideline:   Go to ED Now (or to Office with PCP Approval)  Reason For Disposition Reached:   Thigh, calf, or ankle swelling in both legs, but one side is definitely more swollen  Advice Given:  N/A  Patient Will Follow Care Advice:  YES

## 2013-09-04 ENCOUNTER — Telehealth: Payer: Self-pay | Admitting: Internal Medicine

## 2013-09-04 NOTE — Telephone Encounter (Signed)
C/D 3/18/5 for appt. 09/11/13

## 2013-09-04 NOTE — Telephone Encounter (Signed)
S/W WITH PATIENT DTR AND GAVE NEW PATIENT APPT FOR 3/25 @ 10:30 W/DR. CHISM.  °REFERRING DR. ADAM KENDALL °DX- MULTIPLE MYELOMA °WELCOME PACKET MAILED.  °

## 2013-09-10 ENCOUNTER — Encounter: Payer: Self-pay | Admitting: Internal Medicine

## 2013-09-10 ENCOUNTER — Ambulatory Visit (INDEPENDENT_AMBULATORY_CARE_PROVIDER_SITE_OTHER): Payer: Medicare Other | Admitting: Internal Medicine

## 2013-09-10 VITALS — BP 120/86 | HR 104 | Temp 98.3°F | Resp 20

## 2013-09-10 DIAGNOSIS — C7951 Secondary malignant neoplasm of bone: Secondary | ICD-10-CM

## 2013-09-10 DIAGNOSIS — I10 Essential (primary) hypertension: Secondary | ICD-10-CM

## 2013-09-10 DIAGNOSIS — S72143A Displaced intertrochanteric fracture of unspecified femur, initial encounter for closed fracture: Secondary | ICD-10-CM

## 2013-09-10 DIAGNOSIS — S72142A Displaced intertrochanteric fracture of left femur, initial encounter for closed fracture: Secondary | ICD-10-CM

## 2013-09-10 DIAGNOSIS — I2510 Atherosclerotic heart disease of native coronary artery without angina pectoris: Secondary | ICD-10-CM

## 2013-09-10 DIAGNOSIS — C7952 Secondary malignant neoplasm of bone marrow: Secondary | ICD-10-CM

## 2013-09-10 NOTE — Progress Notes (Signed)
Subjective:    Patient ID: [name], male    DOB: [birth year], 80 y.o.   MRN: [id number]  HPI   80 year old patient who is seen today in followup.  He was seen last month for URI symptoms and treated symptomatically.  He was reevaluated 3 weeks ago due to lower extremity edema and placed on diuretic therapy.  He later developed a pain, especially on the right hip and groin area and was seen by orthopedics.  Evaluation included a MRI of the right hip that revealed multiple osseous lesions consistent with multiple myeloma or metastatic disease. Patient has had some constipation issues, but not much in the way of constitutional complaints.  He still has mild cough, which is nonproductive.  No recent chest x-ray. He was hospitalized last year for a left hip fracture.  Evaluation included radiographs as well as CT scanning of the left hip area and no osseous lesions were noted.  At that time Laboratory studies were reviewed from January the revealed no anemia or elevated total protein. He denies having a prior colonoscopy.  Past Medical History  Diagnosis Date  . CORONARY ARTERY DISEASE 12/26/2006  . HYPERLIPIDEMIA 12/26/2006  . HYPERTENSION 12/26/2006  . PERIPHERAL VASCULAR DISEASE 12/26/2006    History   Social History  . Marital Status: Married    Spouse Name: N/A    Number of Children: N/A  . Years of Education: N/A   Occupational History  . Not on file.   Social History Main Topics  . Smoking status: Former Smoker -- 0.50 packs/day    Types: Cigarettes    Quit date: 06/20/1978  . Smokeless tobacco: Never Used  . Alcohol Use: 0.6 oz/week    1 Glasses of wine per week     Comment: rarely  . Drug Use: No  . Sexual Activity: No   Other Topics Concern  . Not on file   Social History Narrative  . No narrative on file    Past Surgical History  Procedure Laterality Date  . Abdominal aortic aneurysm repair    . Transurethral resection of prostate    . Hernia repair     ingunial  . Cardiac catheterization    . Intramedullary (im) nail intertrochanteric Left 12/03/2012    Procedure: INTRAMEDULLARY (IM) NAIL INTERTROCHANTRIC;  Surgeon: Wylene Simmer, MD;  Location: [facility];  Service: Orthopedics;  Laterality: Left;    Family History  Problem Relation Age of Onset  . Diabetes Mother   . Hypertension Mother     Allergies  Allergen Reactions  . Cardura [Doxazosin Mesylate] Other (See Comments)    Upset stomach with generic doxazosin only.    Current Outpatient Prescriptions on File Prior to Visit  Medication Sig Dispense Refill  . furosemide (LASIX) 20 MG tablet Take 1 tablet (20 mg total) by mouth daily.  20 tablet  0  . aspirin EC 325 MG EC tablet Take 1 tablet (325 mg total) by mouth daily with breakfast.  42 tablet  0  . doxazosin (CARDURA) 4 MG tablet Take 4 mg by mouth every morning.      . metoprolol (LOPRESSOR) 100 MG tablet TAKE ONE-HALF (1/2) TABLET (50 MG) TWICE A DAY  90 tablet  1  . Multiple Vitamin (MULTIVITAMIN WITH MINERALS) TABS Take 1 tablet by mouth every morning.      . simvastatin (ZOCOR) 20 MG tablet TAKE 1 TABLET DAILY AT BEDTIME  90 tablet  1  . temazepam (RESTORIL) 15 MG capsule Take  1 capsule (15 mg total) by mouth at bedtime as needed.  90 capsule  1   No current facility-administered medications on file prior to visit.    BP 120/86  Pulse 104  Temp(Src) 98.3 F (36.8 C) (Oral)  Resp 20  SpO2 93%        Review of Systems  Constitutional: Negative for fever, chills, appetite change and fatigue.  HENT: Negative for congestion, dental problem, ear pain, hearing loss, sore throat, tinnitus, trouble swallowing and voice change.   Eyes: Negative for pain, discharge and visual disturbance.  Respiratory: Negative for cough, chest tightness, wheezing and stridor.   Cardiovascular: Negative for chest pain, palpitations and leg swelling.  Gastrointestinal: Positive for constipation. Negative for nausea, vomiting, abdominal  pain, blood in stool and abdominal distention.  Genitourinary: Negative for urgency, hematuria, flank pain, discharge, difficulty urinating and genital sores.  Musculoskeletal: Positive for arthralgias and gait problem. Negative for back pain, joint swelling, myalgias and neck stiffness.  Skin: Negative for rash.  Neurological: Negative for dizziness, syncope, speech difficulty, weakness, numbness and headaches.  Hematological: Negative for adenopathy. Does not bruise/bleed easily.  Psychiatric/Behavioral: Negative for behavioral problems and dysphoric mood. The patient is not nervous/anxious.        Objective:   Physical Exam  Constitutional: He is oriented to person, place, and time. He appears well-developed and well-nourished. No distress.  Comfortable at rest Sitting in a wheelchair Afebrile Pulse 100 2 saturation 93%  HENT:  Head: Normocephalic.  Right Ear: External ear normal.  Left Ear: External ear normal.  Eyes: Conjunctivae and EOM are normal.  Neck: Normal range of motion.  Cardiovascular: Normal rate and normal heart sounds.   Mild resting tachycardia with rate of 100  Pulmonary/Chest: Effort normal.  Diminished breath sounds at both bases with  inspiratory rales, L>R Shallow inspirations   Abdominal: Bowel sounds are normal.  Musculoskeletal: Normal range of motion. He exhibits edema. He exhibits no tenderness.  Only trace edema Compression hose in place  Neurological: He is alert and oriented to person, place, and time.  Skin:  No clubbing  Psychiatric: He has a normal mood and affect. His behavior is normal.          Assessment & Plan:   Multiple pelvic osseous lesions.  Rule out multiple myeloma versus metastatic disease.  A chest x-ray was considered, but the patient states that he requires two-person assistance to transfer from a wheelchair.  The patient has an oncology appointment, tomorrow.  We'll defer Hypertension CAD History of left hip  fracture

## 2013-09-10 NOTE — Progress Notes (Signed)
Pre-visit discussion using our clinic review tool. No additional management support is needed unless otherwise documented below in the visit note.  

## 2013-09-10 NOTE — Patient Instructions (Signed)
Limit your sodium (Salt) intake  Oncology follow up tomorrow as scheduled

## 2013-09-11 ENCOUNTER — Other Ambulatory Visit (HOSPITAL_BASED_OUTPATIENT_CLINIC_OR_DEPARTMENT_OTHER): Payer: Medicare Other

## 2013-09-11 ENCOUNTER — Encounter: Payer: Self-pay | Admitting: Internal Medicine

## 2013-09-11 ENCOUNTER — Other Ambulatory Visit: Payer: Self-pay | Admitting: Internal Medicine

## 2013-09-11 ENCOUNTER — Other Ambulatory Visit: Payer: Medicare Other

## 2013-09-11 ENCOUNTER — Ambulatory Visit: Payer: Medicare Other

## 2013-09-11 ENCOUNTER — Ambulatory Visit (HOSPITAL_BASED_OUTPATIENT_CLINIC_OR_DEPARTMENT_OTHER): Payer: Medicare Other | Admitting: Internal Medicine

## 2013-09-11 ENCOUNTER — Telehealth: Payer: Self-pay | Admitting: Internal Medicine

## 2013-09-11 ENCOUNTER — Ambulatory Visit (HOSPITAL_COMMUNITY)
Admission: RE | Admit: 2013-09-11 | Discharge: 2013-09-11 | Disposition: A | Discharge status: Home / Self Care | Payer: Medicare Other | Source: Ambulatory Visit | Attending: Internal Medicine | Admitting: Internal Medicine

## 2013-09-11 VITALS — BP 124/80 | HR 94 | Temp 97.6°F | Resp 16 | Ht 65.0 in

## 2013-09-11 DIAGNOSIS — C7951 Secondary malignant neoplasm of bone: Secondary | ICD-10-CM | POA: Insufficient documentation

## 2013-09-11 DIAGNOSIS — C801 Malignant (primary) neoplasm, unspecified: Secondary | ICD-10-CM | POA: Insufficient documentation

## 2013-09-11 DIAGNOSIS — I739 Peripheral vascular disease, unspecified: Secondary | ICD-10-CM

## 2013-09-11 DIAGNOSIS — M25559 Pain in unspecified hip: Secondary | ICD-10-CM

## 2013-09-11 DIAGNOSIS — I10 Essential (primary) hypertension: Secondary | ICD-10-CM

## 2013-09-11 DIAGNOSIS — C7952 Secondary malignant neoplasm of bone marrow: Principal | ICD-10-CM

## 2013-09-11 DIAGNOSIS — S72142A Displaced intertrochanteric fracture of left femur, initial encounter for closed fracture: Secondary | ICD-10-CM

## 2013-09-11 DIAGNOSIS — Z87891 Personal history of nicotine dependence: Secondary | ICD-10-CM

## 2013-09-11 DIAGNOSIS — I2510 Atherosclerotic heart disease of native coronary artery without angina pectoris: Secondary | ICD-10-CM

## 2013-09-11 DIAGNOSIS — N281 Cyst of kidney, acquired: Secondary | ICD-10-CM | POA: Insufficient documentation

## 2013-09-11 DIAGNOSIS — K439 Ventral hernia without obstruction or gangrene: Secondary | ICD-10-CM | POA: Insufficient documentation

## 2013-09-11 DIAGNOSIS — R222 Localized swelling, mass and lump, trunk: Secondary | ICD-10-CM

## 2013-09-11 DIAGNOSIS — N200 Calculus of kidney: Secondary | ICD-10-CM | POA: Insufficient documentation

## 2013-09-11 LAB — CBC WITH DIFFERENTIAL/PLATELET
BASO%: 0.5 % (ref 0.0–2.0)
Basophils Absolute: 0.1 10*3/uL (ref 0.0–0.1)
EOS%: 0.7 % (ref 0.0–7.0)
Eosinophils Absolute: 0.1 10*3/uL (ref 0.0–0.5)
HEMATOCRIT: 45.2 % (ref 38.4–49.9)
HGB: 14.9 g/dL (ref 13.0–17.1)
LYMPH%: 10 % — AB (ref 14.0–49.0)
MCH: 28.5 pg (ref 27.2–33.4)
MCHC: 32.9 g/dL (ref 32.0–36.0)
MCV: 86.5 fL (ref 79.3–98.0)
MONO#: 1.3 10*3/uL — ABNORMAL HIGH (ref 0.1–0.9)
MONO%: 10.9 % (ref 0.0–14.0)
NEUT#: 9 10*3/uL — ABNORMAL HIGH (ref 1.5–6.5)
NEUT%: 77.9 % — AB (ref 39.0–75.0)
PLATELETS: 194 10*3/uL (ref 140–400)
RBC: 5.23 10*6/uL (ref 4.20–5.82)
RDW: 13.7 % (ref 11.0–14.6)
WBC: 11.6 10*3/uL — ABNORMAL HIGH (ref 4.0–10.3)
lymph#: 1.2 10*3/uL (ref 0.9–3.3)

## 2013-09-11 LAB — COMPREHENSIVE METABOLIC PANEL (CC13)
ALT: 35 U/L (ref 0–55)
AST: 26 U/L (ref 5–34)
Albumin: 3.2 g/dL — ABNORMAL LOW (ref 3.5–5.0)
Alkaline Phosphatase: 89 U/L (ref 40–150)
Anion Gap: 13 mEq/L — ABNORMAL HIGH (ref 3–11)
BUN: 18 mg/dL (ref 7.0–26.0)
CO2: 23 mEq/L (ref 22–29)
CREATININE: 0.9 mg/dL (ref 0.7–1.3)
Calcium: 9.8 mg/dL (ref 8.4–10.4)
Chloride: 106 mEq/L (ref 98–109)
Glucose: 139 mg/dl (ref 70–140)
Potassium: 3.4 mEq/L — ABNORMAL LOW (ref 3.5–5.1)
Sodium: 143 mEq/L (ref 136–145)
Total Bilirubin: 0.71 mg/dL (ref 0.20–1.20)
Total Protein: 6.9 g/dL (ref 6.4–8.3)

## 2013-09-11 LAB — LACTATE DEHYDROGENASE (CC13): LDH: 379 U/L — ABNORMAL HIGH (ref 125–245)

## 2013-09-11 MED ORDER — IOHEXOL 300 MG/ML  SOLN
50.0000 mL | Freq: Once | INTRAMUSCULAR | Status: AC | PRN
  Administered 2013-09-11: 50 mL via ORAL

## 2013-09-11 MED ORDER — IOHEXOL 300 MG/ML  SOLN
100.0000 mL | Freq: Once | INTRAMUSCULAR | Status: AC | PRN
  Administered 2013-09-11: 100 mL via INTRAVENOUS

## 2013-09-11 MED ORDER — HYDROCODONE-ACETAMINOPHEN 10-325 MG PO TABS: 1.0000 | ORAL_TABLET | Freq: Four times a day (QID) | ORAL | Status: DC | PRN

## 2013-09-11 NOTE — Progress Notes (Signed)
Checked in new pt with no financial concerns. °

## 2013-09-11 NOTE — Telephone Encounter (Signed)
gv and printed appt sched and avs for pt for April....sent pt to radioloty and pt will come back to have lab

## 2013-09-11 NOTE — Progress Notes (Signed)
Pt has not been taking his medications for the past 2 weeks due to new problems. Just taking his pain medication.

## 2013-09-12 NOTE — Progress Notes (Signed)
Received notes from [facility] from visit on 3.19.2015.  Pt seen for follow up on their lower leg.  Pt had MRI at the office on this day.  Pt to follow up as needed.    Received an addendum report from 3.9.15:  Multiple osseous masses as described above with the 2 largest areas located in the left superior ilium and right anterior acetabulum/superior public ramus.  Differential diagnoses includes metastatic disease versus multiple myeloma.  Right gluteus medicus muscle strain with partial tear at the trochanteric insertion.   Sent to scan.

## 2013-09-13 ENCOUNTER — Other Ambulatory Visit: Payer: Self-pay | Admitting: Internal Medicine

## 2013-09-13 DIAGNOSIS — C7951 Secondary malignant neoplasm of bone: Secondary | ICD-10-CM

## 2013-09-13 LAB — SPEP & IFE WITH QIG
ALPHA-2-GLOBULIN: 15.9 % — AB (ref 7.1–11.8)
Albumin ELP: 48.7 % — ABNORMAL LOW (ref 55.8–66.1)
Alpha-1-Globulin: 10.1 % — ABNORMAL HIGH (ref 2.9–4.9)
BETA 2: 6 % (ref 3.2–6.5)
Beta Globulin: 6.5 % (ref 4.7–7.2)
Gamma Globulin: 12.8 % (ref 11.1–18.8)
IGA: 357 mg/dL (ref 68–379)
IGG (IMMUNOGLOBIN G), SERUM: 880 mg/dL (ref 650–1600)
IgM, Serum: 60 mg/dL (ref 41–251)
Total Protein, Serum Electrophoresis: 6.3 g/dL (ref 6.0–8.3)

## 2013-09-13 LAB — KAPPA/LAMBDA LIGHT CHAINS
Kappa free light chain: 2 mg/dL — ABNORMAL HIGH (ref 0.33–1.94)
Kappa:Lambda Ratio: 0.68 (ref 0.26–1.65)
LAMBDA FREE LGHT CHN: 2.96 mg/dL — AB (ref 0.57–2.63)

## 2013-09-13 LAB — BETA 2 MICROGLOBULIN, SERUM: Beta-2 Microglobulin: 3.43 mg/L — ABNORMAL HIGH (ref <=2.51)

## 2013-09-13 LAB — PSA: PSA: 1.13 ng/mL (ref <=4.00)

## 2013-09-15 NOTE — Progress Notes (Signed)
[place] Telephone:([phone]) [phone]   Fax:(336) [phone]  NEW PATIENT EVALUATION   Name: TREVIS EDEN Date: 09/15/2013 MRN: [id number] DOB: 1929/04/17  PCP: Nyoka Cowden, MD   REFERRING PHYSICIAN: Marletta Lor, MD  REASON FOR REFERRAL: Multiple osseous metastases   HISTORY OF PRESENT ILLNESS:Gedalia A Ringgenberg is a 80 y.o. male  with a past medical history of CAD/PVDz and hypertension and hyperlipidemia is here for further evaluation of his multiple osseous metastases.  The patient has been followed by [facility] (Dr. Vickki Hearing on 08/26/2013) for hips and anterior thigh pain over the past one month.  Symptoms included pain described as aching and throbbing with stiffness and aggravated with weightbearing.  He has had difficulty with ambulating due to pain.  He has been taking Norco 5-325 mg  one tablet every 6 hours for the pain.  He denies any additional bone pain.  He denies hematuria or hematochezia.  He denies weight lost.  He reports stool changes over the past few weeks described as loose with "wheat flakes" consistency.  He has never had a colonoscopy.  He denies abdominal pain. He also denies chest pain.   Dr. Delilah Shan ordered an MRI of his right hip without contrast which revealed multiple osseous masses as described above with the 2 largest areas located in the left superior ilium and right anterior acetabulum/ superior pubic ramus.  Differential diagnoses included metastatic disease versus multiple myeloma.  There was also right gluteus medius muscle strain with partial tear at the trochanteric insertion.    He lives with in [place].  He does endorse a smoking history but stopped at age 42 years old.  He smoked for 30 plus pack years prior to quitting.  His PCP ordered a chest xray (report not reviewed).  He reports that he stopped taking his medications for his blood pressure and focuses mainly on taking his pain medications.     PAST MEDICAL HISTORY:  has a past medical history of CORONARY ARTERY DISEASE (12/26/2006); HYPERLIPIDEMIA (12/26/2006); HYPERTENSION (12/26/2006); and PERIPHERAL VASCULAR DISEASE (12/26/2006).     PAST SURGICAL HISTORY: Past Surgical History  Procedure Laterality Date  . Abdominal aortic aneurysm repair    . Transurethral resection of prostate    . Hernia repair      ingunial  . Cardiac catheterization    . Intramedullary (im) nail intertrochanteric Left 12/03/2012    Procedure: INTRAMEDULLARY (IM) NAIL INTERTROCHANTRIC;  Surgeon: Wylene Simmer, MD;  Location: [facility];  Service: Orthopedics;  Laterality: Left;     CURRENT MEDICATIONS: has a current medication list which includes the following prescription(s): hydrocodone-acetaminophen, aspirin, doxazosin, furosemide, metoprolol, multivitamin with minerals, simvastatin, and temazepam.   ALLERGIES: Cardura   SOCIAL HISTORY:  reports that he quit smoking about 35 years ago. His smoking use included Cigarettes. He smoked 0.50 packs per day. He has never used smokeless tobacco. He reports that he drinks about 0.6 ounces of alcohol per week. He reports that he does not use illicit drugs.   FAMILY HISTORY: family history includes Diabetes in his mother; Hypertension in his mother.   LABORATORY DATA:  CBC    Component Value Date/Time   WBC 11.6* 09/11/2013 1026   WBC 9.0 07/08/2013 0940   RBC 5.23 09/11/2013 1026   RBC 5.25 07/08/2013 0940   HGB 14.9 09/11/2013 1026   HGB 15.6 07/08/2013 0940   HCT 45.2 09/11/2013 1026   HCT 46.0 07/08/2013 0940   PLT 194 09/11/2013 1026  PLT 175.0 07/08/2013 0940   MCV 86.5 09/11/2013 1026   MCV 87.6 07/08/2013 0940   MCH 28.5 09/11/2013 1026   MCH 30.2 12/06/2012 0523   MCHC 32.9 09/11/2013 1026   MCHC 34.0 07/08/2013 0940   RDW 13.7 09/11/2013 1026   RDW 13.7 07/08/2013 0940   LYMPHSABS 1.2 09/11/2013 1026   LYMPHSABS 1.3 07/08/2013 0940   MONOABS 1.3* 09/11/2013 1026   MONOABS 0.9 07/08/2013 0940   EOSABS 0.1  09/11/2013 1026   EOSABS 0.1 07/08/2013 0940   BASOSABS 0.1 09/11/2013 1026   BASOSABS 0.0 07/08/2013 0940    CMP     Component Value Date/Time   NA 143 09/11/2013 1026   NA 140 07/08/2013 0940   K 3.4* 09/11/2013 1026   K 3.2* 07/08/2013 0940   CL 101 07/08/2013 0940   CO2 23 09/11/2013 1026   CO2 31 07/08/2013 0940   GLUCOSE 139 09/11/2013 1026   GLUCOSE 94 07/08/2013 0940   GLUCOSE 101* 04/12/2006 1156   BUN 18.0 09/11/2013 1026   BUN 23 07/08/2013 0940   CREATININE 0.9 09/11/2013 1026   CREATININE 1.1 07/08/2013 0940   CALCIUM 9.8 09/11/2013 1026   CALCIUM 9.1 07/08/2013 0940   PROT 6.9 09/11/2013 1026   PROT 6.9 07/08/2013 0940   ALBUMIN 3.2* 09/11/2013 1026   ALBUMIN 3.9 07/08/2013 0940   AST 26 09/11/2013 1026   AST 18 07/08/2013 0940   ALT 35 09/11/2013 1026   ALT 14 07/08/2013 0940   ALKPHOS 89 09/11/2013 1026   ALKPHOS 70 07/08/2013 0940   BILITOT 0.71 09/11/2013 1026   BILITOT 0.8 07/08/2013 0940   GFRNONAA 68* 12/06/2012 0523   GFRAA 79* 12/06/2012 0523   Results for PACO, CISLO (MRN [id number]) as of 09/15/2013 12:56  Ref. Range 09/11/2013 10:25  LDH Latest Range: 125-245 U/L 379 (H)  Beta-2 Microglobulin Latest Range: <=2.51 mg/L 3.43 (H)  Albumin ELP Latest Range: 55.8-66.1 % 48.7 (L)  COMMENT (PROTEIN ELECTROPHOR) No range found *  Alpha-1-Globulin Latest Range: 2.9-4.9 % 10.1 (H)  Alpha-2-Globulin Latest Range: 7.1-11.8 % 15.9 (H)  Beta Globulin Latest Range: 4.7-7.2 % 6.5  Beta 2 Latest Range: 3.2-6.5 % 6.0  Gamma Globulin Latest Range: 11.1-18.8 % 12.8  M-SPIKE, % No range found NOT DET  SPE Interp. No range found *  IgG (Immunoglobin G), Serum Latest Range: [phone] mg/dL 880  IgA Latest Range: 68-379 mg/dL 357  IgM, Serum Latest Range: 41-251 mg/dL 60  Total Protein, Serum Electrophoresis Latest Range: 6.0-8.3 g/dL 6.3  Kappa free light chain Latest Range: 0.33-1.94 mg/dL 2.00 (H)  Lambda Free Lght Chn Latest Range: 0.57-2.63 mg/dL 2.96 (H)  Kappa:Lambda Ratio Latest  Range: 0.26-1.65  0.68  PSA Latest Range: <=4.00 ng/mL 1.13   RADIOGRAPHY: Ct Chest W Contrast  09/11/2013   CLINICAL DATA:  Bone metastasis  EXAM: CT CHEST, ABDOMEN, AND PELVIS WITH CONTRAST  TECHNIQUE: Multidetector CT imaging of the chest, abdomen and pelvis was performed following the standard protocol during bolus administration of intravenous contrast.  CONTRAST:  37mL OMNIPAQUE IOHEXOL 300 MG/ML SOLN, 154mL OMNIPAQUE IOHEXOL 300 MG/ML SOLN  COMPARISON:  None.  FINDINGS: CT CHEST FINDINGS  Within the right lower lobe there is a consolidative mass measuring 2.5 x 5.5 cm in the medial aspect of the right lower lobe. There is enlarged right hilar lymph node measuring 20 mm. There are large right lower paratracheal lymph nodes measuring 1.5 cm short axis (image 20. There is a subcarinal lymph node  measuring 15 mm short axis. Small high right paratracheal lymph nodes are also suspicious for metastasis. No clear evidence of supraclavicular adenopathy. No axillary adenopathy.  This evidence of bone metastasis with a expansile lesion within the right anterior rib (image 28). There is a aggressive lesion within the upper sternum (image 27.  CT ABDOMEN AND PELVIS FINDINGS  No focal hepatic lesion. The gallbladder, pancreas, spleen, and adrenal glands are normal. Bilateral nonobstructing renal calculi. There is a nonenhancing cysts within the left kidney. Superior to the right kidney there is a nodular lesion measuring 14 mm adjacent to the posterior medial right hepatic lobe (image 51).  Stomach, small bowel, colon are unremarkable there is a ventral hernia which contains a loop sigmoid colon without evidence obstruction. There is moderate volume stool in the transverse colon.  The distal rectum is circumferentially thickened to 17 mm. No pelvic lymphadenopathy. No retroperitoneal periportal lymphadenopathy.  Large lytic lesion in the posterior left iliac bone with soft tissue expansion measuring 4.1 x 4.1 cm  (image 87, series 2).  All lesion in the right iliac wing (image 81). Larger lesion within the right ischium involving the acetabulum with a potential pathologic fracture.  Subcutaneous lesion along the anterior right chest wall at the costal margin measuring 15 mm (image 60).  IMPRESSION: 1. Consolidative mass in the right lower lobe is concerning for primary lung cancer. Consider small cell lung cancer. 2. Right paratracheal and subcarinal adenopathy is consistent with mediastinal nodal metastasis. 3. Skeletal metastasis involving the right ribs, sternum, and large expansile lesions in the pelvis. 4. Retroperitoneal nodular lesion in the right suprarenal location is concerning for metastasis. 5. Diffuse thickening of the rectum. Cannot exclude excreted rectal carcinoma as etiology of metastasis although less favored. 6. Subcutaneous nodule along the right chest wall is indeterminate but again cannot exclude metastasis. 7. Due to the multitude of findings, an FDG PET scan may complete staging although this may not be necessary. 8. Ventral hernia contains a loop of sigmoid colon.   Electronically Signed   By: Suzy Bouchard M.D.   On: 09/11/2013 16:27   Ct Abdomen Pelvis W Contrast  09/13/2013   CLINICAL DATA:  Bone metastasis  EXAM: CT CHEST, ABDOMEN, AND PELVIS WITH CONTRAST  TECHNIQUE: Multidetector CT imaging of the chest, abdomen and pelvis was performed following the standard protocol during bolus administration of intravenous contrast.  CONTRAST:  50mL OMNIPAQUE IOHEXOL 300 MG/ML SOLN, 137mL OMNIPAQUE IOHEXOL 300 MG/ML SOLN  COMPARISON:  None.  FINDINGS: CT CHEST FINDINGS  Within the right lower lobe there is a consolidative mass measuring 2.5 x 5.5 cm in the medial aspect of the right lower lobe. There is enlarged right hilar lymph node measuring 20 mm. There are large right lower paratracheal lymph nodes measuring 1.5 cm short axis (image 20. There is a subcarinal lymph node measuring 15 mm short axis.  Small high right paratracheal lymph nodes are also suspicious for metastasis. No clear evidence of supraclavicular adenopathy. No axillary adenopathy.  This evidence of bone metastasis with a expansile lesion within the right anterior rib (image 28). There is a aggressive lesion within the upper sternum (image 27.  CT ABDOMEN AND PELVIS FINDINGS  No focal hepatic lesion. The gallbladder, pancreas, spleen, and adrenal glands are normal. Bilateral nonobstructing renal calculi. There is a nonenhancing cysts within the left kidney. Superior to the right kidney there is a nodular lesion measuring 14 mm adjacent to the posterior medial right hepatic lobe (image 51).  Stomach, small bowel, colon are unremarkable there is a ventral hernia which contains a loop sigmoid colon without evidence obstruction. There is moderate volume stool in the transverse colon.  The distal rectum is circumferentially thickened to 17 mm. No pelvic lymphadenopathy. No retroperitoneal periportal lymphadenopathy.  Large lytic lesion in the posterior left iliac bone with soft tissue expansion measuring 4.1 x 4.1 cm (image 87, series 2).  All lesion in the right iliac wing (image 81). Larger lesion within the right ischium involving the acetabulum with a potential pathologic fracture.  Subcutaneous lesion along the anterior right chest wall at the costal margin measuring 15 mm (image 60).  IMPRESSION: 1. Consolidative mass in the right lower lobe is concerning for primary lung cancer. Consider small cell lung cancer. 2. Right paratracheal and subcarinal adenopathy is consistent with mediastinal nodal metastasis. 3. Skeletal metastasis involving the right ribs, sternum, and large expansile lesions in the pelvis. 4. Retroperitoneal nodular lesion in the right suprarenal location is concerning for metastasis. 5. Diffuse thickening of the rectum. Cannot exclude excreted rectal carcinoma as etiology of metastasis although less favored. 6. Subcutaneous  nodule along the right chest wall is indeterminate but again cannot exclude metastasis. 7. Due to the multitude of findings, an FDG PET scan may complete staging although this may not be necessary. 8. Ventral hernia contains a loop of sigmoid colon.   Electronically Signed   By: Genevive Bi M.D.   On: 09/13/2013 09:04   Mr Hip Right Wo Contrast  08/26/2013   ADDENDUM REPORT: 08/26/2013 15:04  ADDENDUM: IMPRESSION:  1. Multiple osseous masses as described above with the 2 largest areas located in the left superior ilium and right anterior acetabulum/superior pubic ramus. Differential diagnoses includes metastatic disease versus multiple myeloma. 2. Right gluteus medius muscle strain with partial tear at the trochanteric insertion.   Electronically Signed   By: Elige Ko   On: 08/26/2013 15:04   08/26/2013   CLINICAL DATA:  Pelvic pain.  Right hip pain.  EXAM: MRI OF THE RIGHT HIP WITHOUT CONTRAST  TECHNIQUE: Multiplanar, multisequence MR imaging was performed. No intravenous contrast was administered.  COMPARISON:  None.  FINDINGS: There is abnormal T1 hypointense marrow signal with mild expansion involving the right superior pubic ramus extending into the anterior acetabulum. There is a 5.1 x 4.7 cm T1 hyperintense mass in the left ilium. There is a 17 mm T1 hypointense lesion in the right posterior ilium adjacent to the SI joint. There is a small 11 mm T1 hypointense lesion in the L5 vertebral body. There is a 18 mm T1 hypointense lesion in the right greater trochanter. There is a small 15 mm T1 hypointense lesion in the right inferior pubic ramus. There are other subcentimeter T1 hyperintense lesions within the pelvis.  There is a left femoral intra medullary nail and interlocking femoral neck screw transfixing an old healed fracture. There is no acute fracture or dislocation.  There is muscle edema within the right gluteus medius muscle concerning for muscle strain with partial tear at the trochanteric  insertion.  There is no bursal fluid.  The hamstring origins are normal.  There is no pelvic free fluid. There is no fluid collection or hematoma. There is a homogeneous the T1 hyperintense mass measuring 5.8 x 5.2 cm adjacent to the left quadratus femoris muscle likely reflecting a lipoma.  IMPRESSION: 1. Multiple osseous mass is as described above with the 2 largest areas located in the left superior ilium and right anterior  acetabulum/superior pubic ramus. Differential diagnoses includes metastatic disease versus multiple myeloma. 2. Right gluteus medius muscle strain with partial tear at the trochanteric insertion.  Electronically Signed: By: Kathreen Devoid On: 08/26/2013 14:53     REVIEW OF SYSTEMS:  Constitutional: Denies fevers, chills or abnormal weight loss; R. Hip pain as noted above in HPI.  Eyes: Denies blurriness of vision Ears, nose, mouth, throat, and face: Denies mucositis or sore throat Respiratory: Denies cough, dyspnea or wheezes Cardiovascular: Denies palpitation, chest discomfort or lower extremity swelling Gastrointestinal:  Denies nausea, heartburn or change in bowel habits Skin: Denies abnormal skin rashes Lymphatics: Denies new lymphadenopathy or easy bruising Neurological:Denies numbness, tingling or new weaknesses Behavioral/Psych: Mood is stable, no new changes  All other systems were reviewed with the patient and are negative.  PHYSICAL EXAM:  height is $RemoveB'5\' 5"'gOnRykKW$  (1.651 m). His oral temperature is 97.6 F (36.4 C). His blood pressure is 124/80 and his pulse is 94. His respiration is 16 and oxygen saturation is 92%.    GENERAL:alert, no distress and comfortable; HOH, sitting in wheelchair.  SKIN: skin color, texture, turgor are normal, no rashes or significant lesions EYES: normal, Conjunctiva are pink and non-injected, sclera clear OROPHARYNX:no exudate, no erythema and lips, buccal mucosa, and tongue normal  NECK: supple, thyroid normal size, non-tender, without  nodularity LYMPH:  no palpable lymphadenopathy in the cervical, axillary or inguinal LUNGS: clear to auscultation and percussion with normal breathing effort HEART: regular rate & rhythm and no murmurs and no lower extremity edema ABDOMEN:abdomen soft, non-tender and normal bowel sounds Musculoskeletal:no cyanosis of digits and no clubbing  NEURO: alert & oriented x 3 with fluent speech, no focal motor/sensory deficits   IMPRESSION: HODGE STACHNIK is a 80 y.o. male with a history of    PLAN:  1.  Multiple osseus metastases concerning for metastatic Small cell carcinoma of R lung.  --We reviewed his labs and history and imaging with the patient including his MRI of his right hip as noted with multiple osseous metastases. We then ordered the CT of chest/abdomen to further work-up his likely malignancy and multiple myeloma studies, given it was also in the differentia.  Multiple myeloma work up was negative.  His CT of chest/abdomen and pelvis however  revealed a consolidative mass in the right lower lobe is concerning for primary lung cancer, consider small cell lung cancer.  It also revealed extensive disease including  right paratracheal and subcarinal adenopathy; skeletal metastasis involving the right ribs, sternum, and large expansile lesions in the pelvis;  retroperitoneal nodular lesion in the right suprarenal location is concerning for metastasis; diffuse thickening of the rectum (cannot exclude excreted rectal carcinoma as etiology of metastasis although less favored);  subcutaneous nodule along the right chest wall is indeterminate but again cannot exclude metastasis.  We discussed these findings with the patient over the phone at his request.  He is aware that he likely has Stage IV lung cancer versus colon cancer (though the former is favored) based on the above report.    --Due to the multitude of findings, we will order an FDG PET scan to complete staging.  We will order a CT guided  tissue biopsy of one of the lesions in the pelvis to obtain a tissue diagnosis. PSA is negative.   2. R hip pain secondary to #1. --We will refer to radiation oncology for palliative XRT to the right hip.  We will consider WBRT if his pathology is determined to be  small cell carcinoma as suspected per radiology.  We provided an additional prescription for his NORCO 10-325 mg every 6 hours as needed for his pain (#60).  He was counseled extensively on the constipation effects of this medication.  3. Follow-up.  -- Patient will follow-up with [place] on 04/06 to discuss the results of his PET/CT and tissue biopsy.  We hope to start palliative XRT to his right hip for pain control.   All questions were answered. The patient knows to call the clinic with any problems, questions or concerns. We can certainly see the patient much sooner if necessary.  I spent 40 minutes counseling the patient face to face. The total time spent in the appointment was 60 minutes.    Niyam Bisping, MD 09/15/2013 12:44 PM                    HPI

## 2013-09-16 ENCOUNTER — Telehealth: Payer: Self-pay | Admitting: Internal Medicine

## 2013-09-16 NOTE — Telephone Encounter (Signed)
S/W PT DTR RE APPT W/DR MOODY FOR 4/8. ALSO CONFIRMED APPT W/DR CHISM 4/6 AND BX 4/2 AND PET 4/3. PER DTR SHE HAD THESE APPTS.

## 2013-09-17 ENCOUNTER — Telehealth: Payer: Self-pay | Admitting: Internal Medicine

## 2013-09-17 NOTE — Telephone Encounter (Signed)
Pt given rr for two stair lifts they have lost the prescriptions, pt needs you to refax rx for stair lift to [place] [phone] This way pt can get off their taxes.pls call when done.

## 2013-09-18 ENCOUNTER — Encounter (HOSPITAL_COMMUNITY): Payer: Self-pay | Admitting: Pharmacy Technician

## 2013-09-18 ENCOUNTER — Other Ambulatory Visit: Payer: Self-pay | Admitting: Radiology

## 2013-09-18 NOTE — Telephone Encounter (Signed)
Spoke to [place] told her Rx for [employer] was faxed to [facility]. Mechele Claude verbalized understanding.

## 2013-09-19 ENCOUNTER — Ambulatory Visit (HOSPITAL_COMMUNITY)
Admission: RE | Admit: 2013-09-19 | Discharge: 2013-09-19 | Disposition: A | Discharge status: Home / Self Care | Payer: Medicare Other | Source: Ambulatory Visit | Attending: Internal Medicine | Admitting: Internal Medicine

## 2013-09-19 ENCOUNTER — Encounter (HOSPITAL_COMMUNITY): Payer: Self-pay

## 2013-09-19 DIAGNOSIS — M25559 Pain in unspecified hip: Secondary | ICD-10-CM | POA: Insufficient documentation

## 2013-09-19 DIAGNOSIS — Z7982 Long term (current) use of aspirin: Secondary | ICD-10-CM | POA: Insufficient documentation

## 2013-09-19 DIAGNOSIS — C419 Malignant neoplasm of bone and articular cartilage, unspecified: Secondary | ICD-10-CM | POA: Insufficient documentation

## 2013-09-19 DIAGNOSIS — C7800 Secondary malignant neoplasm of unspecified lung: Secondary | ICD-10-CM | POA: Insufficient documentation

## 2013-09-19 DIAGNOSIS — C7951 Secondary malignant neoplasm of bone: Secondary | ICD-10-CM

## 2013-09-19 LAB — CBC
HCT: 39.5 % (ref 39.0–52.0)
HEMOGLOBIN: 13.2 g/dL (ref 13.0–17.0)
MCH: 28.8 pg (ref 26.0–34.0)
MCHC: 33.4 g/dL (ref 30.0–36.0)
MCV: 86.2 fL (ref 78.0–100.0)
PLATELETS: 177 10*3/uL (ref 150–400)
RBC: 4.58 MIL/uL (ref 4.22–5.81)
RDW: 14 % (ref 11.5–15.5)
WBC: 12.3 10*3/uL — ABNORMAL HIGH (ref 4.0–10.5)

## 2013-09-19 LAB — PROTIME-INR
INR: 1.34 (ref 0.00–1.49)
Prothrombin Time: 16.3 seconds — ABNORMAL HIGH (ref 11.6–15.2)

## 2013-09-19 LAB — APTT: aPTT: 34 seconds (ref 24–37)

## 2013-09-19 MED ORDER — FENTANYL CITRATE 0.05 MG/ML IJ SOLN
INTRAMUSCULAR | Status: AC
  Filled 2013-09-19: qty 4

## 2013-09-19 MED ORDER — SODIUM CHLORIDE 0.9 % IV SOLN: INTRAVENOUS | Status: DC

## 2013-09-19 MED ORDER — MIDAZOLAM HCL 2 MG/2ML IJ SOLN
INTRAMUSCULAR | Status: AC
  Filled 2013-09-19: qty 4

## 2013-09-19 MED ORDER — LIDOCAINE HCL 1 % IJ SOLN
INTRAMUSCULAR | Status: AC
  Filled 2013-09-19: qty 10

## 2013-09-19 MED ORDER — MIDAZOLAM HCL 2 MG/2ML IJ SOLN
INTRAMUSCULAR | Status: AC | PRN
Start: 2013-09-19 — End: 2013-09-19
  Administered 2013-09-19: 2 mg via INTRAVENOUS

## 2013-09-19 MED ORDER — FENTANYL CITRATE 0.05 MG/ML IJ SOLN
INTRAMUSCULAR | Status: AC | PRN
  Administered 2013-09-19: 50 ug via INTRAVENOUS

## 2013-09-19 NOTE — Progress Notes (Signed)
UP TO BATHROOM WITH ASSISTANCE

## 2013-09-19 NOTE — H&P (Signed)
Agree. For biopsy of left iliac bone lesion under CT guidance today.

## 2013-09-19 NOTE — Discharge Instructions (Signed)
Needle Biopsy Care After  These instructions give you information on caring for yourself after your procedure. Your doctor may also give you more specific instructions. Call your doctor if you have any problems or questions after your procedure. HOME CARE  Rest for 4 hours after your biopsy, except for getting up to go to the bathroom or as told.  Keep the places where the needles were put in clean and dry.  Do not put powder or lotion on the sites.  Do not shower until 24 hours after the test. Remove all bandages (dressings) before showering.  Remove all bandages at least once every day. Gently clean the sites with soap and water. Keep putting a new bandage on until the skin is closed. Finding out the results of your test Ask your doctor when your test results will be ready. Make sure you follow up and get the test results. GET HELP RIGHT AWAY IF:   You have shortness of breath or trouble breathing.  You have pain or cramping in your belly (abdomen).  You feel sick to your stomach (nauseous) or throw up (vomit).  Any of the places where the needles were put in:  Are puffy (swollen) or red.  Are sore or hot to the touch.  Are draining yellowish-white fluid (pus).  Are bleeding after 10 minutes of pressing down on the site. Have someone keep pressing on any place that is bleeding until you see a doctor.  You have any unusual pain that will not stop.  You have a fever. If you go to the emergency room, tell the nurse that you had a biopsy. Take this paper with you to show the nurse. MAKE SURE YOU:   Understand these instructions.  Will watch your condition.  Will get help right away if you are not doing well or get worse. Document Released: 05/19/2008 Document Revised: 08/29/2011 Document Reviewed: 05/19/2008 [facility] Patient Information 2014 [place].

## 2013-09-19 NOTE — Procedures (Signed)
Procedure:  CT guided core biopsy of left iliac bone lesion Findings:  Lytic, destructive lesion of left posterior iliac bone sampled via 17 G needle with 18 G core biopsy x 4.  No complications.

## 2013-09-19 NOTE — H&P (Signed)
Chief Complaint: "I am here for a biopsy." Referring Physician: Dr. Juliann Mule HPI: [name] is an 80 y.o. male with right hip pain and weakness now in a wheelchair because he is unable to put weight on his legs, imaging studies revealed multiple osseous metastases and a RLL lung mass. IR received request for image guided biopsy, left iliac soft tissue mass with decreased risk compared to other areas of concern. He denies any chest pain, shortness of breath or palpitations. He denies any active signs of bleeding or excessive bruising. He denies any recent fever or chills. The patient denies any history of sleep apnea or chronic oxygen use. He is unsure is he has had sedation previously, but denies any known complications.     Past Medical History:  Past Medical History  Diagnosis Date  . CORONARY ARTERY DISEASE 12/26/2006  . HYPERLIPIDEMIA 12/26/2006  . HYPERTENSION 12/26/2006  . PERIPHERAL VASCULAR DISEASE 12/26/2006    Past Surgical History:  Past Surgical History  Procedure Laterality Date  . Abdominal aortic aneurysm repair    . Transurethral resection of prostate    . Hernia repair      ingunial  . Cardiac catheterization    . Intramedullary (im) nail intertrochanteric Left 12/03/2012    Procedure: INTRAMEDULLARY (IM) NAIL INTERTROCHANTRIC;  Surgeon: Wylene Simmer, MD;  Location: [facility];  Service: Orthopedics;  Laterality: Left;    Family History:  Family History  Problem Relation Age of Onset  . Diabetes Mother   . Hypertension Mother     Social History:  reports that he quit smoking about 35 years ago. His smoking use included Cigarettes. He smoked 0.50 packs per day. He has never used smokeless tobacco. He reports that he drinks about 0.6 ounces of alcohol per week. He reports that he does not use illicit drugs.  Allergies:  Allergies  Allergen Reactions  . Cardura [Doxazosin Mesylate] Other (See Comments)    Upset stomach with generic doxazosin only.    Medications:    Medication List    ASK your doctor about these medications       aspirin 325 MG EC tablet  Take 1 tablet (325 mg total) by mouth daily with breakfast.     doxazosin 4 MG tablet  Commonly known as:  CARDURA  Take 4 mg by mouth every morning.     furosemide 20 MG tablet  Commonly known as:  LASIX  Take 1 tablet (20 mg total) by mouth daily.     HYDROcodone-acetaminophen 10-325 MG per tablet  Commonly known as:  NORCO  Take 1 tablet by mouth every 6 (six) hours as needed.     metoprolol 100 MG tablet  Commonly known as:  LOPRESSOR  Take 50 mg by mouth 2 (two) times daily.     multivitamin with minerals Tabs tablet  Take 1 tablet by mouth every morning.     simvastatin 20 MG tablet  Commonly known as:  ZOCOR  Take 20 mg by mouth at bedtime.       Please HPI for pertinent positives, otherwise complete 10 system ROS negative.  Physical Exam: BP 138/80  Pulse 49  Temp(Src) 98.2 F (36.8 C) (Oral)  Resp 18  Ht 5\' 5"  (1.651 m)  Wt 165 lb (74.844 kg)  BMI 27.46 kg/m2  SpO2 96% Body mass index is 27.46 kg/(m^2).  General Appearance:  Alert, cooperative, no distress  Head:  Normocephalic, without obvious abnormality, atraumatic  Neck: Supple, symmetrical, trachea midline  Lungs:  Clear to auscultation bilaterally, no w/r/r, respirations unlabored without use of accessory muscles.  Chest Wall:  No tenderness or deformity  Heart:  Regular rate and rhythm, S1, S2 normal, no murmur, rub or gallop.  Abdomen:   Soft, non-tender, non distended.  Extremities: Extremities normal, atraumatic, no cyanosis or edema  Neurologic: Normal affect, no gross deficits.   Results for orders placed during the hospital encounter of 09/19/13 (from the past 48 hour(s))  APTT     Status: None   Collection Time    09/19/13  9:21 AM      Result Value Ref Range   aPTT 34  24 - 37 seconds  CBC     Status: Abnormal   Collection Time    09/19/13  9:21 AM      Result Value Ref Range   WBC 12.3  (*) 4.0 - 10.5 K/uL   RBC 4.58  4.22 - 5.81 MIL/uL   Hemoglobin 13.2  13.0 - 17.0 g/dL   HCT 39.5  39.0 - 52.0 %   MCV 86.2  78.0 - 100.0 fL   MCH 28.8  26.0 - 34.0 pg   MCHC 33.4  30.0 - 36.0 g/dL   RDW 14.0  11.5 - 15.5 %   Platelets 177  150 - 400 K/uL  PROTIME-INR     Status: Abnormal   Collection Time    09/19/13  9:21 AM      Result Value Ref Range   Prothrombin Time 16.3 (*) 11.6 - 15.2 seconds   INR 1.34  0.00 - 1.49   No results found.  Assessment/Plan Multiple osseous metastases.  RLL lung mass.  Request for image guided left iliac soft tissue mass biopsy Patient has been NPO, labs and images reviewed, no blood thinners taken. Risks and Benefits discussed with the patient. All of the patient's questions were answered, patient is agreeable to proceed. Consent signed and in chart.   Tsosie Billing D PA-C 09/19/2013, 11:01 AM

## 2013-09-20 ENCOUNTER — Ambulatory Visit (HOSPITAL_COMMUNITY): Payer: Medicare Other

## 2013-09-20 NOTE — Progress Notes (Addendum)
Histology and Location of Primary Cancer: Metastatic RLL  Lung /bone mets  Sites of Visceral and Bony Metastatic Disease: lytic bone lesions right ribs, upper sternum, right hip,right pelvis, left posterior iliac bone.   Biopsy :09/19/13,  of left posterior  iliac bone mass:Diagnosis Bone, biopsy, Posterior Left iliac bone - POSITIVE FOR ADENOCARCINOMA. - SEE COMMENT. Microscopic Comment  Location(s) of Symptomatic Metastases:right hip,weakness ,unable to put weight on his legs  Past/Anticipated chemotherapy by medical oncology, if any: Appt Dr.Chism 09/15/13, Pets scan scheduled 09/23/13 at 7am, appt with Aw[facility] 09/23/13 at 2:45pm   Pain on a scale of 0-10 is: 8/10 unable to stand, rt leg , takes hydrocodone prn, last taken 420am today, pain increases with standing   If Spine Met(s), symptoms, if any, include:  Bowel/Bladder retention or incontinence (please describe):  Constipation, last bowel movement this past Sunday, small stool black, stated,  freqency voiding,   Numbness or weakness in extremities (please describe): legs, weakness,unable to put weight on legs,in w/c  Current Decadron regimen, if applicable: no   Ambulatory status? Walker? Wheelchair?: Wheel chair  SAFETY ISSUES:Yes, fall risk  Prior radiation? No  Pacemaker/ICD? No  Is the patient on methotrexate? No  Current Complaints / other details:  Married, 4 children, former smoker quit 1980, .5ppd, , Hx of AAA repair, TURP, Cardiac Catheterization,MRI right hip,multiple osseous lesions , left hip fracture 1 year ago,

## 2013-09-23 ENCOUNTER — Telehealth: Payer: Self-pay | Admitting: Internal Medicine

## 2013-09-23 ENCOUNTER — Ambulatory Visit (HOSPITAL_BASED_OUTPATIENT_CLINIC_OR_DEPARTMENT_OTHER): Payer: Medicare Other | Admitting: Physician Assistant

## 2013-09-23 ENCOUNTER — Encounter (HOSPITAL_COMMUNITY)
Admission: RE | Admit: 2013-09-23 | Discharge: 2013-09-23 | Disposition: A | Discharge status: Home / Self Care | Payer: Medicare Other | Source: Ambulatory Visit | Attending: Internal Medicine | Admitting: Internal Medicine

## 2013-09-23 ENCOUNTER — Encounter (HOSPITAL_COMMUNITY): Payer: Self-pay

## 2013-09-23 ENCOUNTER — Encounter: Payer: Self-pay | Admitting: Physician Assistant

## 2013-09-23 VITALS — BP 115/55 | HR 102 | Temp 97.3°F | Resp 18 | Ht 65.0 in

## 2013-09-23 DIAGNOSIS — C7951 Secondary malignant neoplasm of bone: Secondary | ICD-10-CM

## 2013-09-23 DIAGNOSIS — C7952 Secondary malignant neoplasm of bone marrow: Secondary | ICD-10-CM

## 2013-09-23 DIAGNOSIS — I2510 Atherosclerotic heart disease of native coronary artery without angina pectoris: Secondary | ICD-10-CM

## 2013-09-23 DIAGNOSIS — I10 Essential (primary) hypertension: Secondary | ICD-10-CM

## 2013-09-23 DIAGNOSIS — C3490 Malignant neoplasm of unspecified part of unspecified bronchus or lung: Secondary | ICD-10-CM

## 2013-09-23 LAB — GLUCOSE, CAPILLARY: Glucose-Capillary: 127 mg/dL — ABNORMAL HIGH (ref 70–99)

## 2013-09-23 MED ORDER — FLUDEOXYGLUCOSE F - 18 (FDG) INJECTION
7.7000 | Freq: Once | INTRAVENOUS | Status: AC | PRN
  Administered 2013-09-23: 7.7 via INTRAVENOUS

## 2013-09-23 NOTE — Telephone Encounter (Signed)
Okay for referral?

## 2013-09-23 NOTE — Telephone Encounter (Signed)
gv adn printed apt sched and avs for pt for April

## 2013-09-23 NOTE — Telephone Encounter (Signed)
ok 

## 2013-09-23 NOTE — Telephone Encounter (Signed)
Left message on voicemail to call office.  

## 2013-09-23 NOTE — Progress Notes (Signed)
Hematology and Oncology Follow Up Visit  [name] [id number] 01-Jun-1929 80 y.o. 09/23/2013 5:29 PM  Principle Diagnosis: Stage IV non-small cell lung cancer, adenocarcinoma  Prior Therapy: None  Current therapy: none  Interim History:  Patient presents accompanied by his daughter Randa Evens for followup office visit. He has been extensively imaged in an effort to diagnosis the cause of his extensive bony metastasis, including a PET scan from the skull base to thigh. He underwent a recent bone biopsy of the posterior left iliac bone. The specimen was largely necrotic but was positive for adenocarcinoma. As the specimen is almost entirely necrotic per [facility] the specimen is not the best sample for molecular testing and the results are not guaranteed. The Accession number is [id number]. When the patient presented with such extensive osseous disease was initially thought related to multiple myeloma however his multiple myeloma workup was negative. The PET scan from 09/23/2013 revealed widespread metastatic lung cancer with hypermetabolic mediastinal and right hilar adenopathy, right lower lobe masslike consolidation, right pleural nodularity, left upper quadrant of omental nodule, a subcutaneous soft tissue nodules in multifocal osseous metastatic disease.  The patient's primary complaint is that of right hip pain with weight bearing. He is scheduled to see Dr. Mitzi Hansen on Wednesday of this week for consideration for palliative radiotherapy for pain management. He is currently taking Norco 10/325 one tablet every 6 hours as needed for pain.He gives a remote history of smoking.   Medications: I have reviewed the patient's current medications.  Allergies:  Allergies  Allergen Reactions  . Cardura [Doxazosin Mesylate] Other (See Comments)    Upset stomach with generic doxazosin only.    Past Medical History, Surgical history, Social history, and Family History were reviewed and  updated.  Review of Systems: Constitutional:  Negative for fever, chills, night sweats, anorexia, weight loss, pain. Cardiovascular: no chest pain or dyspnea on exertion Respiratory: no cough, shortness of breath, or wheezing Neurological: no TIA or stroke symptoms Dermatological: negative ENT: negative Gastrointestinal: no abdominal pain, change in bowel habits, or black or bloody stools Genito-Urinary: no dysuria, trouble voiding, or hematuria Hematological and Lymphatic: negative Breast: negative Musculoskeletal: positive for - joint pain and pain in hip - right Remaining ROS negative.  Physical Exam: Blood pressure 115/55, pulse 102, temperature 97.3 F (36.3 C), temperature source Oral, resp. rate 18, height 5\' 5"  (1.651 m), weight 0 lb (0 kg), SpO2 96.00%. ECOG:  General appearance: alert, cooperative, appears stated age and no distress Head: Normocephalic, without obvious abnormality, atraumatic Mouth: No evidence of thrush or mucositis Neck: no adenopathy, no carotid bruit, no JVD, supple, symmetrical, trachea midline and thyroid not enlarged, symmetric, no tenderness/mass/nodules Lymph nodes: Cervical, supraclavicular, and axillary nodes normal. Resp: clear to auscultation bilaterally Cardio: regular rate and rhythm, S1, S2 normal, no murmur, click, rub or gallop GI: soft, non-tender; bowel sounds normal; no masses,  no organomegaly Extremities: Point tenderness over right greater trochanteric region    Lab Results: Lab Results  Component Value Date   WBC 12.3* 09/19/2013   HGB 13.2 09/19/2013   HCT 39.5 09/19/2013   MCV 86.2 09/19/2013   PLT 177 09/19/2013     Chemistry      Component Value Date/Time   NA 143 09/11/2013 1026   NA 140 07/08/2013 0940   K 3.4* 09/11/2013 1026   K 3.2* 07/08/2013 0940   CL 101 07/08/2013 0940   CO2 23 09/11/2013 1026   CO2 31 07/08/2013 0940   BUN 18.0  09/11/2013 1026   BUN 23 07/08/2013 0940   CREATININE 0.9 09/11/2013 1026   CREATININE 1.1  07/08/2013 0940      Component Value Date/Time   CALCIUM 9.8 09/11/2013 1026   CALCIUM 9.1 07/08/2013 0940   ALKPHOS 89 09/11/2013 1026   ALKPHOS 70 07/08/2013 0940   AST 26 09/11/2013 1026   AST 18 07/08/2013 0940   ALT 35 09/11/2013 1026   ALT 14 07/08/2013 0940   BILITOT 0.71 09/11/2013 1026   BILITOT 0.8 07/08/2013 0940       Radiological Studies: Ct Chest W Contrast  09/11/2013   CLINICAL DATA:  Bone metastasis  EXAM: CT CHEST, ABDOMEN, AND PELVIS WITH CONTRAST  TECHNIQUE: Multidetector CT imaging of the chest, abdomen and pelvis was performed following the standard protocol during bolus administration of intravenous contrast.  CONTRAST:  17mL OMNIPAQUE IOHEXOL 300 MG/ML SOLN, 126mL OMNIPAQUE IOHEXOL 300 MG/ML SOLN  COMPARISON:  None.  FINDINGS: CT CHEST FINDINGS  Within the right lower lobe there is a consolidative mass measuring 2.5 x 5.5 cm in the medial aspect of the right lower lobe. There is enlarged right hilar lymph node measuring 20 mm. There are large right lower paratracheal lymph nodes measuring 1.5 cm short axis (image 20. There is a subcarinal lymph node measuring 15 mm short axis. Small high right paratracheal lymph nodes are also suspicious for metastasis. No clear evidence of supraclavicular adenopathy. No axillary adenopathy.  This evidence of bone metastasis with a expansile lesion within the right anterior rib (image 28). There is a aggressive lesion within the upper sternum (image 27.  CT ABDOMEN AND PELVIS FINDINGS  No focal hepatic lesion. The gallbladder, pancreas, spleen, and adrenal glands are normal. Bilateral nonobstructing renal calculi. There is a nonenhancing cysts within the left kidney. Superior to the right kidney there is a nodular lesion measuring 14 mm adjacent to the posterior medial right hepatic lobe (image 51).  Stomach, small bowel, colon are unremarkable there is a ventral hernia which contains a loop sigmoid colon without evidence obstruction. There is  moderate volume stool in the transverse colon.  The distal rectum is circumferentially thickened to 17 mm. No pelvic lymphadenopathy. No retroperitoneal periportal lymphadenopathy.  Large lytic lesion in the posterior left iliac bone with soft tissue expansion measuring 4.1 x 4.1 cm (image 87, series 2).  All lesion in the right iliac wing (image 81). Larger lesion within the right ischium involving the acetabulum with a potential pathologic fracture.  Subcutaneous lesion along the anterior right chest wall at the costal margin measuring 15 mm (image 60).  IMPRESSION: 1. Consolidative mass in the right lower lobe is concerning for primary lung cancer. Consider small cell lung cancer. 2. Right paratracheal and subcarinal adenopathy is consistent with mediastinal nodal metastasis. 3. Skeletal metastasis involving the right ribs, sternum, and large expansile lesions in the pelvis. 4. Retroperitoneal nodular lesion in the right suprarenal location is concerning for metastasis. 5. Diffuse thickening of the rectum. Cannot exclude excreted rectal carcinoma as etiology of metastasis although less favored. 6. Subcutaneous nodule along the right chest wall is indeterminate but again cannot exclude metastasis. 7. Due to the multitude of findings, an FDG PET scan may complete staging although this may not be necessary. 8. Ventral hernia contains a loop of sigmoid colon.   Electronically Signed   By: Suzy Bouchard M.D.   On: 09/11/2013 16:27   Ct Abdomen Pelvis W Contrast  09/13/2013   CLINICAL DATA:  Bone metastasis  EXAM: CT CHEST, ABDOMEN, AND PELVIS WITH CONTRAST  TECHNIQUE: Multidetector CT imaging of the chest, abdomen and pelvis was performed following the standard protocol during bolus administration of intravenous contrast.  CONTRAST:  66mL OMNIPAQUE IOHEXOL 300 MG/ML SOLN, 121mL OMNIPAQUE IOHEXOL 300 MG/ML SOLN  COMPARISON:  None.  FINDINGS: CT CHEST FINDINGS  Within the right lower lobe there is a consolidative  mass measuring 2.5 x 5.5 cm in the medial aspect of the right lower lobe. There is enlarged right hilar lymph node measuring 20 mm. There are large right lower paratracheal lymph nodes measuring 1.5 cm short axis (image 20. There is a subcarinal lymph node measuring 15 mm short axis. Small high right paratracheal lymph nodes are also suspicious for metastasis. No clear evidence of supraclavicular adenopathy. No axillary adenopathy.  This evidence of bone metastasis with a expansile lesion within the right anterior rib (image 28). There is a aggressive lesion within the upper sternum (image 27.  CT ABDOMEN AND PELVIS FINDINGS  No focal hepatic lesion. The gallbladder, pancreas, spleen, and adrenal glands are normal. Bilateral nonobstructing renal calculi. There is a nonenhancing cysts within the left kidney. Superior to the right kidney there is a nodular lesion measuring 14 mm adjacent to the posterior medial right hepatic lobe (image 51).  Stomach, small bowel, colon are unremarkable there is a ventral hernia which contains a loop sigmoid colon without evidence obstruction. There is moderate volume stool in the transverse colon.  The distal rectum is circumferentially thickened to 17 mm. No pelvic lymphadenopathy. No retroperitoneal periportal lymphadenopathy.  Large lytic lesion in the posterior left iliac bone with soft tissue expansion measuring 4.1 x 4.1 cm (image 87, series 2).  All lesion in the right iliac wing (image 81). Larger lesion within the right ischium involving the acetabulum with a potential pathologic fracture.  Subcutaneous lesion along the anterior right chest wall at the costal margin measuring 15 mm (image 60).  IMPRESSION: 1. Consolidative mass in the right lower lobe is concerning for primary lung cancer. Consider small cell lung cancer. 2. Right paratracheal and subcarinal adenopathy is consistent with mediastinal nodal metastasis. 3. Skeletal metastasis involving the right ribs, sternum,  and large expansile lesions in the pelvis. 4. Retroperitoneal nodular lesion in the right suprarenal location is concerning for metastasis. 5. Diffuse thickening of the rectum. Cannot exclude excreted rectal carcinoma as etiology of metastasis although less favored. 6. Subcutaneous nodule along the right chest wall is indeterminate but again cannot exclude metastasis. 7. Due to the multitude of findings, an FDG PET scan may complete staging although this may not be necessary. 8. Ventral hernia contains a loop of sigmoid colon.   Electronically Signed   By: Suzy Bouchard M.D.   On: 09/13/2013 09:04   Mr Hip Right Wo Contrast  08/26/2013   ADDENDUM REPORT: 08/26/2013 15:04  ADDENDUM: IMPRESSION:  1. Multiple osseous masses as described above with the 2 largest areas located in the left superior ilium and right anterior acetabulum/superior pubic ramus. Differential diagnoses includes metastatic disease versus multiple myeloma. 2. Right gluteus medius muscle strain with partial tear at the trochanteric insertion.   Electronically Signed   By: Kathreen Devoid   On: 08/26/2013 15:04   08/26/2013   CLINICAL DATA:  Pelvic pain.  Right hip pain.  EXAM: MRI OF THE RIGHT HIP WITHOUT CONTRAST  TECHNIQUE: Multiplanar, multisequence MR imaging was performed. No intravenous contrast was administered.  COMPARISON:  None.  FINDINGS: There is abnormal T1 hypointense marrow  signal with mild expansion involving the right superior pubic ramus extending into the anterior acetabulum. There is a 5.1 x 4.7 cm T1 hyperintense mass in the left ilium. There is a 17 mm T1 hypointense lesion in the right posterior ilium adjacent to the SI joint. There is a small 11 mm T1 hypointense lesion in the L5 vertebral body. There is a 18 mm T1 hypointense lesion in the right greater trochanter. There is a small 15 mm T1 hypointense lesion in the right inferior pubic ramus. There are other subcentimeter T1 hyperintense lesions within the pelvis.  There  is a left femoral intra medullary nail and interlocking femoral neck screw transfixing an old healed fracture. There is no acute fracture or dislocation.  There is muscle edema within the right gluteus medius muscle concerning for muscle strain with partial tear at the trochanteric insertion.  There is no bursal fluid.  The hamstring origins are normal.  There is no pelvic free fluid. There is no fluid collection or hematoma. There is a homogeneous the T1 hyperintense mass measuring 5.8 x 5.2 cm adjacent to the left quadratus femoris muscle likely reflecting a lipoma.  IMPRESSION: 1. Multiple osseous mass is as described above with the 2 largest areas located in the left superior ilium and right anterior acetabulum/superior pubic ramus. Differential diagnoses includes metastatic disease versus multiple myeloma. 2. Right gluteus medius muscle strain with partial tear at the trochanteric insertion.  Electronically Signed: By: Kathreen Devoid On: 08/26/2013 14:53   Nm Pet Image Initial (pi) Skull Base To Thigh  09/23/2013   CLINICAL DATA:  Initial treatment strategy for small cell lung cancer with bone metastases.  EXAM: NUCLEAR MEDICINE PET SKULL BASE TO THIGH  TECHNIQUE: 7.7 mCi F-18 FDG was injected intravenously. Full-ring PET imaging was performed from the skull base to thigh after the radiotracer. CT data was obtained and used for attenuation correction and anatomic localization.  FASTING BLOOD GLUCOSE:  Value: 127 mg/dl  COMPARISON:  CT CHEST W/CM dated 09/11/2013  FINDINGS: NECK  No hypermetabolic lymph nodes in the neck. CT images show no acute findings.  CHEST  Hypermetabolic lymph nodes extend from the low right internal jugular station into the right paratracheal station, subcarinal station and right hilum. Index low right paratracheal lymph nodes measure up to 1.5 cm with an SUV max of 5.9 (CT image 55 and PET image 56). Right hilar adenopathy is better measured on 09/11/2013 (2.0 cm) with an SUV max of  approximately 7.6 (PET image 65). It is contiguous with masslike consolidation in the right lower lobe, which has an SUV max of 7.3. A pleural nodule along the anterior margin of the right middle lobe (CT image 68) has an SUV max of 6.8 (PET image 68).  There are scattered hypermetabolic subcutaneous soft tissue nodules with an index lesion in the lateral right paraspinal musculature measuring up to 1.3 x 1.8 cm (posterior to the right scapula, CT image 33), with an SUV max of 5.4 (PET image 33).  CT images show tiny amount of right pleural fluid, new. No pericardial effusion. There are new and enlarging pulmonary nodules. Index right upper lobe nodule measures 5 mm (image 16), previously 2 mm. Largest nodule is seen in the left lower lobe, measuring 9 mm (image 50), new or enlarged. These do not show definite hypermetabolism better 2 2 small for PET resolution.  ABDOMEN/PELVIS  An omental nodule in the left upper quadrant measures 1.1 x 1.6 cm (CT image 90), with  an SUV max of 3.5 (PET image 90). A 1.3 cm nodule in the right suprarenal fossa (CT image 91) does not appear hypermetabolic. Additional hypermetabolic subcutaneous soft tissue nodules are again noted. No abnormal hypermetabolism in the liver, adrenal glands, spleen or pancreas.  CT images show the liver, gallbladder and adrenal glands to be grossly unremarkable. Stones are seen in the right kidney. A 3.4 cm low-attenuation lesion in the upper pole left kidney is unchanged. Interval migration of a left renal stone or stones to the left ureterovesical junction/orifice, measuring 5 mm, collectively (CT image 166). Mild left hydronephrosis is seen in association. Spleen, pancreas, stomach and small bowel are grossly unremarkable. There is a midline ventral hernia containing unobstructed colon. A fair amount of stool is seen in the colon. Previously seen rectal wall thickening appears to have resolved in the interval. Presacral edema persists.  A ventral  midline pelvic wall hernia contains unobstructed small bowel. Small left inguinal hernia contains fat. Atherosclerotic calcification of the arterial vasculature. Postoperative changes in the infrarenal aorta.  SKELETON  There are several hypermetabolic osseous metastases within the spine, sternum, ribs and pelvis. Index mass in the medial left iliac wing measures 4.3 x 5.0 cm with an SUV max of 7.8. Postoperative changes in the proximal left femur.  IMPRESSION: 1. Widespread metastatic lung cancer with hypermetabolic mediastinal and right hilar adenopathy, right lower lobe masslike consolidation, right pleural nodularity, left upper quadrant omental nodule, subcutaneous soft tissue nodules and multifocal osseous metastatic disease. 2. New and enlarging bilateral pulmonary nodules which are too small for PET resolution. 3. Tiny right pleural effusion, new. 4. New left hydronephrosis secondary to a 5 mm stone at the left ureterovesical junction/orifice. These results will be called to the ordering clinician or representative by the Radiologist Assistant, and communication documented in the PACS Dashboard. 5. Coronary artery calcification. 6. Right nephrolithiasis. 7. Ventral hernias contain nonobstructed bowel.   Electronically Signed   By: Lorin Picket M.D.   On: 09/23/2013 09:35   Ct Biopsy  09/19/2013   CLINICAL DATA:  Right lower lung mass and lytic bone lesions of right ribs, sternum, right hip, right pelvis and left posterior iliac bone. The patient presents for biopsy. The largest destructive lesion is within the posterior left iliac bone.  EXAM: CT GUIDED CORE BIOPSY OF LEFT ILIAC BONE  ANESTHESIA/SEDATION: 2.0  Mg IV Versed; 50 mcg IV Fentanyl  Total Moderate Sedation Time: 10 minutes.  PROCEDURE: The procedure risks, benefits, and alternatives were explained to the patient. Questions regarding the procedure were encouraged and answered. The patient understands and consents to the procedure.  The left  gluteal region was prepped with Betadine in a sterile fashion, and a sterile drape was applied covering the operative field. A sterile gown and sterile gloves were used for the procedure. Local anesthesia was provided with 1% Lidocaine.  CT was performed in a prone position. After localizing a posterior left iliac bone mass, a 17 gauge needle was advanced under CT fluoroscopic guidance to the level of the mass. Four separate 18 gauge core biopsy samples were obtained and submitted in formalin.  Complications: None  FINDINGS: Large destructive mass of the posterior left iliac bone is localized and has likely increased in size since prior imaging with greatest diameter of approximately 5 cm. Solid tissue was obtained.  IMPRESSION: CT-guided core biopsy performed of the destructive left posterior iliac bone lesion.   Electronically Signed   By: Aletta Edouard M.D.   On: 09/19/2013  15:24    Impression and Plan: Mr. Willmon is a pleasant 80 year old Caucasian male recently diagnosed with stage IV non-small cell lung cancer, adenocarcinoma. These results were discussed with the patient and his daughter by myself and Dr. Juliann Mule. We recommended that they meet with Dr. Lisbeth Renshaw regarding palliative radiotherapy to the right hip for pain control. We will refill his pain medication later this week once they are able to give [place] a current pill to help of his remaining tablets. Once he has had a couple of weeks of radiotherapy, Dr. Juliann Mule will see him in 2 weeks for a discussion about treatment options. The patient stated that his wife has memory problems and that he may need some help in the home. I will refer him to our social workers for some help and guidance in this area. Both he and his daughter were appreciative of our help as well as frankness with his diagnosis and prognosis.     Carlton Adam, PA-C 4/6/20155:29 PM

## 2013-09-23 NOTE — Telephone Encounter (Signed)
Pt has cancer and needs help with bathing,food preparation and light housekeeping.

## 2013-09-24 ENCOUNTER — Encounter: Payer: Self-pay | Admitting: *Deleted

## 2013-09-24 ENCOUNTER — Other Ambulatory Visit: Payer: Self-pay

## 2013-09-24 ENCOUNTER — Telehealth: Payer: Self-pay

## 2013-09-24 DIAGNOSIS — C7951 Secondary malignant neoplasm of bone: Secondary | ICD-10-CM

## 2013-09-24 NOTE — Telephone Encounter (Signed)
[name] called asking where social work referral stands. They are looking for a home evaluation for possible help in the home. This message was forwarded to [employer]. Pt saw Awilda Metro PA on 09/23/13.

## 2013-09-24 NOTE — Progress Notes (Signed)
[facility] Work  Clinical Social Work was referred by PA and RN for assistance with home care/home care needs.  CSW contacted patient's daughter by phone to discuss concerns.  [name] states patient lives with spouse and they may need assistance in home.  CSW explained difference between home care services (out of pocket expense for daily living activities) and home health services (covered by insurance for skilled RN needs).  [name] expressed understanding, they are currently looking into home care benefits through a long-term insurance plan- she requested an order for a home health evaluation.  CSW communicated with patient's RN regarding request for home health order. CSW encouraged patient to call with any questions or concerns.   Polo Riley, MSW, LCSW, OSW-C Clinical Social Worker [facility] [phone]

## 2013-09-24 NOTE — Telephone Encounter (Signed)
Spoke to [place] told her order for referral for Home Health evaluation was sent and someone will be contacting you. Clarene Critchley said that her father was diagnosed yesterday with terminal cancer and the cancer center is setting up Hospice, so you can cancel [place]. Told her okay, will cancel referral.

## 2013-09-25 ENCOUNTER — Encounter: Payer: Self-pay | Admitting: Radiation Oncology

## 2013-09-25 ENCOUNTER — Ambulatory Visit
Admission: RE | Admit: 2013-09-25 | Discharge: 2013-09-25 | Disposition: A | Discharge status: Home / Self Care | Payer: Medicare Other | Source: Ambulatory Visit | Attending: Radiation Oncology | Admitting: Radiation Oncology

## 2013-09-25 VITALS — BP 128/80 | HR 106 | Temp 98.0°F | Resp 20 | Ht 65.0 in

## 2013-09-25 DIAGNOSIS — Z510 Encounter for antineoplastic radiation therapy: Secondary | ICD-10-CM | POA: Insufficient documentation

## 2013-09-25 DIAGNOSIS — C7951 Secondary malignant neoplasm of bone: Secondary | ICD-10-CM | POA: Insufficient documentation

## 2013-09-25 DIAGNOSIS — C7952 Secondary malignant neoplasm of bone marrow: Secondary | ICD-10-CM

## 2013-09-25 DIAGNOSIS — C3490 Malignant neoplasm of unspecified part of unspecified bronchus or lung: Secondary | ICD-10-CM | POA: Insufficient documentation

## 2013-09-25 DIAGNOSIS — Z7982 Long term (current) use of aspirin: Secondary | ICD-10-CM | POA: Insufficient documentation

## 2013-09-25 DIAGNOSIS — F411 Generalized anxiety disorder: Secondary | ICD-10-CM | POA: Insufficient documentation

## 2013-09-25 DIAGNOSIS — E785 Hyperlipidemia, unspecified: Secondary | ICD-10-CM | POA: Insufficient documentation

## 2013-09-25 DIAGNOSIS — I10 Essential (primary) hypertension: Secondary | ICD-10-CM | POA: Insufficient documentation

## 2013-09-25 DIAGNOSIS — R0609 Other forms of dyspnea: Secondary | ICD-10-CM | POA: Insufficient documentation

## 2013-09-25 DIAGNOSIS — R0989 Other specified symptoms and signs involving the circulatory and respiratory systems: Secondary | ICD-10-CM | POA: Insufficient documentation

## 2013-09-25 DIAGNOSIS — I2510 Atherosclerotic heart disease of native coronary artery without angina pectoris: Secondary | ICD-10-CM | POA: Insufficient documentation

## 2013-09-25 DIAGNOSIS — I739 Peripheral vascular disease, unspecified: Secondary | ICD-10-CM | POA: Insufficient documentation

## 2013-09-25 DIAGNOSIS — Z87891 Personal history of nicotine dependence: Secondary | ICD-10-CM | POA: Insufficient documentation

## 2013-09-25 DIAGNOSIS — Z79899 Other long term (current) drug therapy: Secondary | ICD-10-CM | POA: Insufficient documentation

## 2013-09-25 HISTORY — DX: Allergy, unspecified, initial encounter: T78.40XA

## 2013-09-25 HISTORY — DX: Unspecified osteoarthritis, unspecified site: M19.90

## 2013-09-25 HISTORY — DX: Anxiety disorder, unspecified: F41.9

## 2013-09-25 MED ORDER — HYDROCODONE-ACETAMINOPHEN 10-325 MG PO TABS: 1.0000 | ORAL_TABLET | Freq: Four times a day (QID) | ORAL | Status: DC | PRN

## 2013-09-25 NOTE — Patient Instructions (Signed)
Keep your appointment with Dr. Lisbeth Renshaw regarding palliative radiotherapy to your hip for pain control Followup with Dr. Juliann Mule in 2 weeks to discuss treatment options for your metastatic lung cancer

## 2013-09-25 NOTE — Progress Notes (Signed)
Radiation Oncology         ([phone]) [phone] ________________________________  Name: [name] MRN: [id number]  Date: 09/25/2013  DOB: 09-05-1928  FB:[id number],EUMPN Pilar Plate, MD  Concha Norway, MD     REFERRING PHYSICIAN: Concha Norway, MD   DIAGNOSIS: The encounter diagnosis was Bone metastases.   HISTORY OF PRESENT ILLNESS::[name] is a 80 y.o. male who is seen for an initial consultation visit. The patient is seen today regarding a new diagnosis of metastatic adenocarcinoma with multiple osseous metastases. The patient developed some right hip/ thigh pain. She was seen by orthopedics and an MRI scan was ordered. This was completed on 08/26/2013 and this showed multiple osseous apparent metastases with the largest involving the left superior ilium and the right anterior acetabulum. The differential at that time included metastatic disease versus multiple myeloma.  The patient's workup has included a PET scan again revealed multiple osseous metastases. In addition to those seen on MRI scan multiple other areas were found including a destructive sternal lesion. A right lower lobe masslike consolidation was noted in addition to hypermetabolic mediastinal and right hilar adenopathy. The patient proceeded to undergo a CT guided IC of the left iliac lesion. This returned positive for adenocarcinoma. Significant necrosis was present. Given the clinical picture and imaging findings, this appears to represent a poorly differentiated adenocarcinoma of the lung.  The patient complains of significant ongoing right hip pain. This is worse upon standing and is limiting his ambulation right now. He denies any significant shortness of breath at rest. He does note some dyspnea on exertion. No other significant areas of pain at this time. The patient has begun pain medication.   PREVIOUS RADIATION THERAPY:No}   PAST MEDICAL HISTORY:  has a past medical history of CORONARY ARTERY DISEASE (12/26/2006);  HYPERLIPIDEMIA (12/26/2006); HYPERTENSION (12/26/2006); PERIPHERAL VASCULAR DISEASE (12/26/2006); Allergy; Anxiety; and Arthritis.     PAST SURGICAL HISTORY: Past Surgical History  Procedure Laterality Date  . Abdominal aortic aneurysm repair    . Transurethral resection of prostate    . Hernia repair      ingunial  . Cardiac catheterization    . Intramedullary (im) nail intertrochanteric Left 12/03/2012    Procedure: INTRAMEDULLARY (IM) NAIL INTERTROCHANTRIC;  Surgeon: Wylene Simmer, MD;  Location: [facility];  Service: Orthopedics;  Laterality: Left;     FAMILY HISTORY: family history includes Diabetes in his mother; Hypertension in his mother.   SOCIAL HISTORY:  reports that he quit smoking about 35 years ago. His smoking use included Cigarettes. He smoked 0.50 packs per day. He has never used smokeless tobacco. He reports that he drinks about .6 ounces of alcohol per week. He reports that he does not use illicit drugs.   ALLERGIES: Cardura   MEDICATIONS:  Current Outpatient Prescriptions  Medication Sig Dispense Refill  . aspirin EC 325 MG EC tablet Take 1 tablet (325 mg total) by mouth daily with breakfast.  42 tablet  0  . doxazosin (CARDURA) 4 MG tablet Take 4 mg by mouth every morning.      . furosemide (LASIX) 20 MG tablet Take 1 tablet (20 mg total) by mouth daily.  20 tablet  0  . HYDROcodone-acetaminophen (NORCO) 10-325 MG per tablet Take 1 tablet by mouth every 6 (six) hours as needed.  60 tablet  0  . metoprolol (LOPRESSOR) 100 MG tablet Take 50 mg by mouth 2 (two) times daily.      . Multiple Vitamin (MULTIVITAMIN WITH MINERALS) TABS Take 1  tablet by mouth every morning.      . simvastatin (ZOCOR) 20 MG tablet Take 20 mg by mouth at bedtime.       No current facility-administered medications for this encounter.     REVIEW OF SYSTEMS:  A 15 point review of systems is documented in the electronic medical record. This was obtained by the nursing staff. However, I reviewed this  with the patient to discuss relevant findings and make appropriate changes.  Pertinent items are noted in HPI.    PHYSICAL EXAM:  height is _0  (1.651 m). His oral temperature is 98 F (36.7 C). His blood pressure is 128/80 and his pulse is 106. His respiration is 20 and oxygen saturation is 91%.   ECOG = 2  0 - Asymptomatic (Fully active, able to carry on all predisease activities without restriction)  1 - Symptomatic but completely ambulatory (Restricted in physically strenuous activity but ambulatory and able to carry out work of a light or sedentary nature. For example, light housework, office work)  2 - Symptomatic, <50% in bed during the day (Ambulatory and capable of all self care but unable to carry out any work activities. Up and about more than 50% of waking hours)  3 - Symptomatic, >50% in bed, but not bedbound (Capable of only limited self-care, confined to bed or chair 50% or more of waking hours)  4 - Bedbound (Completely disabled. Cannot carry on any self-care. Totally confined to bed or chair)  5 - Death   Eustace Pen MM, Creech RH, Tormey DC, et al. [phone]). "Toxicity and response criteria of the [facility]". [place] Oncol. 5 (6): 649-55  General: Well-developed, in no acute distress HEENT: Normocephalic, atraumatic Cardiovascular: Regular rate and rhythm Respiratory: Decreased breath sounds in the right base GI: Soft, nontender, normal bowel sounds Extremities: No edema present, no tenderness to the spine. No tenderness to the sternal area.    LABORATORY DATA:  Lab Results  Component Value Date   WBC 12.3* 09/19/2013   HGB 13.2 09/19/2013   HCT 39.5 09/19/2013   MCV 86.2 09/19/2013   PLT 177 09/19/2013   Lab Results  Component Value Date   NA 143 09/11/2013   K 3.4* 09/11/2013   CL 101 07/08/2013   CO2 23 09/11/2013   Lab Results  Component Value Date   ALT 35 09/11/2013   AST 26 09/11/2013   ALKPHOS 89 09/11/2013   BILITOT 0.71 09/11/2013       RADIOGRAPHY: Ct Chest W Contrast  09/11/2013   CLINICAL DATA:  Bone metastasis  EXAM: CT CHEST, ABDOMEN, AND PELVIS WITH CONTRAST  TECHNIQUE: Multidetector CT imaging of the chest, abdomen and pelvis was performed following the standard protocol during bolus administration of intravenous contrast.  CONTRAST:  54m OMNIPAQUE IOHEXOL 300 MG/ML SOLN, 1036mOMNIPAQUE IOHEXOL 300 MG/ML SOLN  COMPARISON:  None.  FINDINGS: CT CHEST FINDINGS  Within the right lower lobe there is a consolidative mass measuring 2.5 x 5.5 cm in the medial aspect of the right lower lobe. There is enlarged right hilar lymph node measuring 20 mm. There are large right lower paratracheal lymph nodes measuring 1.5 cm short axis (image 20. There is a subcarinal lymph node measuring 15 mm short axis. Small high right paratracheal lymph nodes are also suspicious for metastasis. No clear evidence of supraclavicular adenopathy. No axillary adenopathy.  This evidence of bone metastasis with a expansile lesion within the right anterior rib (image 28). There is  a aggressive lesion within the upper sternum (image 27.  CT ABDOMEN AND PELVIS FINDINGS  No focal hepatic lesion. The gallbladder, pancreas, spleen, and adrenal glands are normal. Bilateral nonobstructing renal calculi. There is a nonenhancing cysts within the left kidney. Superior to the right kidney there is a nodular lesion measuring 14 mm adjacent to the posterior medial right hepatic lobe (image 51).  Stomach, small bowel, colon are unremarkable there is a ventral hernia which contains a loop sigmoid colon without evidence obstruction. There is moderate volume stool in the transverse colon.  The distal rectum is circumferentially thickened to 17 mm. No pelvic lymphadenopathy. No retroperitoneal periportal lymphadenopathy.  Large lytic lesion in the posterior left iliac bone with soft tissue expansion measuring 4.1 x 4.1 cm (image 87, series 2).  All lesion in the right iliac wing  (image 81). Larger lesion within the right ischium involving the acetabulum with a potential pathologic fracture.  Subcutaneous lesion along the anterior right chest wall at the costal margin measuring 15 mm (image 60).  IMPRESSION: 1. Consolidative mass in the right lower lobe is concerning for primary lung cancer. Consider small cell lung cancer. 2. Right paratracheal and subcarinal adenopathy is consistent with mediastinal nodal metastasis. 3. Skeletal metastasis involving the right ribs, sternum, and large expansile lesions in the pelvis. 4. Retroperitoneal nodular lesion in the right suprarenal location is concerning for metastasis. 5. Diffuse thickening of the rectum. Cannot exclude excreted rectal carcinoma as etiology of metastasis although less favored. 6. Subcutaneous nodule along the right chest wall is indeterminate but again cannot exclude metastasis. 7. Due to the multitude of findings, an FDG PET scan may complete staging although this may not be necessary. 8. Ventral hernia contains a loop of sigmoid colon.   Electronically Signed   By: Suzy Bouchard M.D.   On: 09/11/2013 16:27   Ct Abdomen Pelvis W Contrast  09/13/2013   CLINICAL DATA:  Bone metastasis  EXAM: CT CHEST, ABDOMEN, AND PELVIS WITH CONTRAST  TECHNIQUE: Multidetector CT imaging of the chest, abdomen and pelvis was performed following the standard protocol during bolus administration of intravenous contrast.  CONTRAST:  51m OMNIPAQUE IOHEXOL 300 MG/ML SOLN, 1055mOMNIPAQUE IOHEXOL 300 MG/ML SOLN  COMPARISON:  None.  FINDINGS: CT CHEST FINDINGS  Within the right lower lobe there is a consolidative mass measuring 2.5 x 5.5 cm in the medial aspect of the right lower lobe. There is enlarged right hilar lymph node measuring 20 mm. There are large right lower paratracheal lymph nodes measuring 1.5 cm short axis (image 20. There is a subcarinal lymph node measuring 15 mm short axis. Small high right paratracheal lymph nodes are also  suspicious for metastasis. No clear evidence of supraclavicular adenopathy. No axillary adenopathy.  This evidence of bone metastasis with a expansile lesion within the right anterior rib (image 28). There is a aggressive lesion within the upper sternum (image 27.  CT ABDOMEN AND PELVIS FINDINGS  No focal hepatic lesion. The gallbladder, pancreas, spleen, and adrenal glands are normal. Bilateral nonobstructing renal calculi. There is a nonenhancing cysts within the left kidney. Superior to the right kidney there is a nodular lesion measuring 14 mm adjacent to the posterior medial right hepatic lobe (image 51).  Stomach, small bowel, colon are unremarkable there is a ventral hernia which contains a loop sigmoid colon without evidence obstruction. There is moderate volume stool in the transverse colon.  The distal rectum is circumferentially thickened to 17 mm. No pelvic lymphadenopathy. No retroperitoneal  periportal lymphadenopathy.  Large lytic lesion in the posterior left iliac bone with soft tissue expansion measuring 4.1 x 4.1 cm (image 87, series 2).  All lesion in the right iliac wing (image 81). Larger lesion within the right ischium involving the acetabulum with a potential pathologic fracture.  Subcutaneous lesion along the anterior right chest wall at the costal margin measuring 15 mm (image 60).  IMPRESSION: 1. Consolidative mass in the right lower lobe is concerning for primary lung cancer. Consider small cell lung cancer. 2. Right paratracheal and subcarinal adenopathy is consistent with mediastinal nodal metastasis. 3. Skeletal metastasis involving the right ribs, sternum, and large expansile lesions in the pelvis. 4. Retroperitoneal nodular lesion in the right suprarenal location is concerning for metastasis. 5. Diffuse thickening of the rectum. Cannot exclude excreted rectal carcinoma as etiology of metastasis although less favored. 6. Subcutaneous nodule along the right chest wall is indeterminate  but again cannot exclude metastasis. 7. Due to the multitude of findings, an FDG PET scan may complete staging although this may not be necessary. 8. Ventral hernia contains a loop of sigmoid colon.   Electronically Signed   By: Suzy Bouchard M.D.   On: 09/13/2013 09:04   Mr Hip Right Wo Contrast  08/26/2013   ADDENDUM REPORT: 08/26/2013 15:04  ADDENDUM: IMPRESSION:  1. Multiple osseous masses as described above with the 2 largest areas located in the left superior ilium and right anterior acetabulum/superior pubic ramus. Differential diagnoses includes metastatic disease versus multiple myeloma. 2. Right gluteus medius muscle strain with partial tear at the trochanteric insertion.   Electronically Signed   By: Kathreen Devoid   On: 08/26/2013 15:04   08/26/2013   CLINICAL DATA:  Pelvic pain.  Right hip pain.  EXAM: MRI OF THE RIGHT HIP WITHOUT CONTRAST  TECHNIQUE: Multiplanar, multisequence MR imaging was performed. No intravenous contrast was administered.  COMPARISON:  None.  FINDINGS: There is abnormal T1 hypointense marrow signal with mild expansion involving the right superior pubic ramus extending into the anterior acetabulum. There is a 5.1 x 4.7 cm T1 hyperintense mass in the left ilium. There is a 17 mm T1 hypointense lesion in the right posterior ilium adjacent to the SI joint. There is a small 11 mm T1 hypointense lesion in the L5 vertebral body. There is a 18 mm T1 hypointense lesion in the right greater trochanter. There is a small 15 mm T1 hypointense lesion in the right inferior pubic ramus. There are other subcentimeter T1 hyperintense lesions within the pelvis.  There is a left femoral intra medullary nail and interlocking femoral neck screw transfixing an old healed fracture. There is no acute fracture or dislocation.  There is muscle edema within the right gluteus medius muscle concerning for muscle strain with partial tear at the trochanteric insertion.  There is no bursal fluid.  The  hamstring origins are normal.  There is no pelvic free fluid. There is no fluid collection or hematoma. There is a homogeneous the T1 hyperintense mass measuring 5.8 x 5.2 cm adjacent to the left quadratus femoris muscle likely reflecting a lipoma.  IMPRESSION: 1. Multiple osseous mass is as described above with the 2 largest areas located in the left superior ilium and right anterior acetabulum/superior pubic ramus. Differential diagnoses includes metastatic disease versus multiple myeloma. 2. Right gluteus medius muscle strain with partial tear at the trochanteric insertion.  Electronically Signed: By: Kathreen Devoid On: 08/26/2013 14:53   Nm Pet Image Initial (pi) Skull Base To  Thigh  09/23/2013   CLINICAL DATA:  Initial treatment strategy for small cell lung cancer with bone metastases.  EXAM: NUCLEAR MEDICINE PET SKULL BASE TO THIGH  TECHNIQUE: 7.7 mCi F-18 FDG was injected intravenously. Full-ring PET imaging was performed from the skull base to thigh after the radiotracer. CT data was obtained and used for attenuation correction and anatomic localization.  FASTING BLOOD GLUCOSE:  Value: 127 mg/dl  COMPARISON:  CT CHEST W/CM dated 09/11/2013  FINDINGS: NECK  No hypermetabolic lymph nodes in the neck. CT images show no acute findings.  CHEST  Hypermetabolic lymph nodes extend from the low right internal jugular station into the right paratracheal station, subcarinal station and right hilum. Index low right paratracheal lymph nodes measure up to 1.5 cm with an SUV max of 5.9 (CT image 55 and PET image 56). Right hilar adenopathy is better measured on 09/11/2013 (2.0 cm) with an SUV max of approximately 7.6 (PET image 65). It is contiguous with masslike consolidation in the right lower lobe, which has an SUV max of 7.3. A pleural nodule along the anterior margin of the right middle lobe (CT image 68) has an SUV max of 6.8 (PET image 68).  There are scattered hypermetabolic subcutaneous soft tissue nodules with an  index lesion in the lateral right paraspinal musculature measuring up to 1.3 x 1.8 cm (posterior to the right scapula, CT image 33), with an SUV max of 5.4 (PET image 33).  CT images show tiny amount of right pleural fluid, new. No pericardial effusion. There are new and enlarging pulmonary nodules. Index right upper lobe nodule measures 5 mm (image 16), previously 2 mm. Largest nodule is seen in the left lower lobe, measuring 9 mm (image 50), new or enlarged. These do not show definite hypermetabolism better 2 2 small for PET resolution.  ABDOMEN/PELVIS  An omental nodule in the left upper quadrant measures 1.1 x 1.6 cm (CT image 90), with an SUV max of 3.5 (PET image 90). A 1.3 cm nodule in the right suprarenal fossa (CT image 91) does not appear hypermetabolic. Additional hypermetabolic subcutaneous soft tissue nodules are again noted. No abnormal hypermetabolism in the liver, adrenal glands, spleen or pancreas.  CT images show the liver, gallbladder and adrenal glands to be grossly unremarkable. Stones are seen in the right kidney. A 3.4 cm low-attenuation lesion in the upper pole left kidney is unchanged. Interval migration of a left renal stone or stones to the left ureterovesical junction/orifice, measuring 5 mm, collectively (CT image 166). Mild left hydronephrosis is seen in association. Spleen, pancreas, stomach and small bowel are grossly unremarkable. There is a midline ventral hernia containing unobstructed colon. A fair amount of stool is seen in the colon. Previously seen rectal wall thickening appears to have resolved in the interval. Presacral edema persists.  A ventral midline pelvic wall hernia contains unobstructed small bowel. Small left inguinal hernia contains fat. Atherosclerotic calcification of the arterial vasculature. Postoperative changes in the infrarenal aorta.  SKELETON  There are several hypermetabolic osseous metastases within the spine, sternum, ribs and pelvis. Index mass in the  medial left iliac wing measures 4.3 x 5.0 cm with an SUV max of 7.8. Postoperative changes in the proximal left femur.  IMPRESSION: 1. Widespread metastatic lung cancer with hypermetabolic mediastinal and right hilar adenopathy, right lower lobe masslike consolidation, right pleural nodularity, left upper quadrant omental nodule, subcutaneous soft tissue nodules and multifocal osseous metastatic disease. 2. New and enlarging bilateral pulmonary nodules which are  too small for PET resolution. 3. Tiny right pleural effusion, new. 4. New left hydronephrosis secondary to a 5 mm stone at the left ureterovesical junction/orifice. These results will be called to the ordering clinician or representative by the Radiologist Assistant, and communication documented in the PACS Dashboard. 5. Coronary artery calcification. 6. Right nephrolithiasis. 7. Ventral hernias contain nonobstructed bowel.   Electronically Signed   By: Lorin Picket M.D.   On: 09/23/2013 09:35   Ct Biopsy  09/19/2013   CLINICAL DATA:  Right lower lung mass and lytic bone lesions of right ribs, sternum, right hip, right pelvis and left posterior iliac bone. The patient presents for biopsy. The largest destructive lesion is within the posterior left iliac bone.  EXAM: CT GUIDED CORE BIOPSY OF LEFT ILIAC BONE  ANESTHESIA/SEDATION: 2.0  Mg IV Versed; 50 mcg IV Fentanyl  Total Moderate Sedation Time: 10 minutes.  PROCEDURE: The procedure risks, benefits, and alternatives were explained to the patient. Questions regarding the procedure were encouraged and answered. The patient understands and consents to the procedure.  The left gluteal region was prepped with Betadine in a sterile fashion, and a sterile drape was applied covering the operative field. A sterile gown and sterile gloves were used for the procedure. Local anesthesia was provided with 1% Lidocaine.  CT was performed in a prone position. After localizing a posterior left iliac bone mass, a 17  gauge needle was advanced under CT fluoroscopic guidance to the level of the mass. Four separate 18 gauge core biopsy samples were obtained and submitted in formalin.  Complications: None  FINDINGS: Large destructive mass of the posterior left iliac bone is localized and has likely increased in size since prior imaging with greatest diameter of approximately 5 cm. Solid tissue was obtained.  IMPRESSION: CT-guided core biopsy performed of the destructive left posterior iliac bone lesion.   Electronically Signed   By: Aletta Edouard M.D.   On: 09/19/2013 15:58       IMPRESSION: The patient has a new diagnosis of metastatic adenocarcinoma of the lung with multiple bony metastases. Symptomatically, he is having significant right hip pain which is associated with bony metastasis at this location. The patient is appropriate to proceed with palliative radiation treatment at this time. He will likely then proceed with palliative systemic treatment subsequently.   I discussed with the patient a potential 2 week course of radiation treatment to the right hip. We discussed the benefit of such treatment in terms of local control as well as the limitations of such a treatment in terms of prior bony destruction. We also discussed the possible side effects and risks of treatment. I also discussed with the patient palliative radiation treatment to 2 other areas based on his PET scan: The left iliac region medially as well as a destructive lesion within the sternum. Each of these sites I believe could be treated without any major increase in side effects. The patient was amenable to this treatment as well which therefore would correspond to treating 3 areas concurrently.     PLAN: The patient will proceed with a simulation later this week as soon as we can plan this. He then will begin palliative radiation treatment next week.       ________________________________   Jodelle Gross, MD, PhD

## 2013-09-26 ENCOUNTER — Other Ambulatory Visit: Payer: Self-pay | Admitting: Medical Oncology

## 2013-09-26 NOTE — Progress Notes (Signed)
Radiology called stating that Dr. Kathlene Cote reviewed the case for the biopsy and states that the order needs to be Ultrasound guided biopsy. Dr. Juliann Mule notified.

## 2013-09-26 NOTE — Progress Notes (Signed)
  ADDENDUM:  Patient is 80 year of with borderline functional status and remote smoking history with newly diagnosed stage IV NSCLC, adenocarcinoma.  He has severe pain due to a destructive left posterior iliac bone lesion.  He is being simulated on Friday by Dr. Lisbeth Renshaw for palliative XRT.  In addition, we will refer him back for an additional biopsy of a subcutaneous soft tissue nodules for molecular testing for EGFR and ALK.  Prior biopsy was mostly necrotic and not suitable for molecular test.  We will see him back in clinic near the end of his palliative XRT and for discussion of molecular tests and further treatment options.   I personally saw this patient and performed a substantive portion of this encounter with the listed APP documented above.   Concha Norway, MD

## 2013-09-27 ENCOUNTER — Ambulatory Visit
Admission: RE | Admit: 2013-09-27 | Discharge: 2013-09-27 | Disposition: A | Discharge status: Home / Self Care | Payer: Medicare Other | Source: Ambulatory Visit | Attending: Radiation Oncology | Admitting: Radiation Oncology

## 2013-09-27 ENCOUNTER — Other Ambulatory Visit: Payer: Self-pay | Admitting: Medical Oncology

## 2013-09-27 ENCOUNTER — Telehealth: Payer: Self-pay | Admitting: Medical Oncology

## 2013-09-27 DIAGNOSIS — C7951 Secondary malignant neoplasm of bone: Secondary | ICD-10-CM

## 2013-09-27 NOTE — Telephone Encounter (Signed)
Joanne-daughter called asking if we have made a referral to [facility] regarding an assessment for home equipment. I called [facility] and spoke with Lurlean Leyden, RN regarding pt. She states he will need a skilled nursing visit to evaluate pt and they can assess for home equipment. Referral made thru Epic. I called Mechele Claude to let her know she should be getting a call from [facility]. She voiced understanding.

## 2013-09-29 ENCOUNTER — Other Ambulatory Visit: Payer: Self-pay | Admitting: Internal Medicine

## 2013-09-29 DIAGNOSIS — C3490 Malignant neoplasm of unspecified part of unspecified bronchus or lung: Secondary | ICD-10-CM

## 2013-09-29 DIAGNOSIS — C7951 Secondary malignant neoplasm of bone: Secondary | ICD-10-CM

## 2013-09-30 ENCOUNTER — Other Ambulatory Visit: Payer: Self-pay | Admitting: Medical Oncology

## 2013-09-30 ENCOUNTER — Other Ambulatory Visit: Payer: Self-pay | Admitting: Radiology

## 2013-09-30 ENCOUNTER — Telehealth: Payer: Self-pay | Admitting: Medical Oncology

## 2013-09-30 ENCOUNTER — Encounter: Payer: Self-pay | Admitting: Medical Oncology

## 2013-09-30 DIAGNOSIS — C7951 Secondary malignant neoplasm of bone: Secondary | ICD-10-CM

## 2013-09-30 DIAGNOSIS — C3490 Malignant neoplasm of unspecified part of unspecified bronchus or lung: Secondary | ICD-10-CM

## 2013-09-30 NOTE — Telephone Encounter (Signed)
I spoke with Joanne-daughter to inform her that the biopsy will be scheduled this week. I informed her that he would not be able to get the biopsy and radiation treatment on the same day due to time and scheduling. I spoke with Dr. Lisbeth Renshaw and he is fine with getting the biopsy and missing one day of radiation. This treatment may be added to the end. She voiced understanding and I made her aware that radiology scheduling will call her with all the instructions.

## 2013-09-30 NOTE — Progress Notes (Signed)
Radiology scheduling called stating that they are attempting to set up the biopsy. Pt is scheduled for radiation daily and the biopsy process will take about 5 hours. I called radiation and I was told that pt is having 3 ares radiated and it will be really hard to reschedule his treatment. Per Dr. Juliann Mule he would like to get the biopsy now instead of waiting until radiation is completed so we will have the path and be able to move forwards with treatment. I called Dr. Lisbeth Renshaw and he is ok with scheduling the biopsy and missing radiation one day. He may add to the end or just skip. I called Morey Hummingbird back in radiology scheduling she she will proceed.

## 2013-10-01 ENCOUNTER — Encounter (HOSPITAL_COMMUNITY): Payer: Self-pay | Admitting: Pharmacy Technician

## 2013-10-01 ENCOUNTER — Telehealth: Payer: Self-pay | Admitting: Internal Medicine

## 2013-10-01 NOTE — Telephone Encounter (Signed)
bx already on schedule. no other orders per 4/12 pof

## 2013-10-02 ENCOUNTER — Ambulatory Visit
Admission: RE | Admit: 2013-10-02 | Discharge: 2013-10-02 | Disposition: A | Discharge status: Home / Self Care | Payer: Medicare Other | Source: Ambulatory Visit | Attending: Radiation Oncology | Admitting: Radiation Oncology

## 2013-10-02 ENCOUNTER — Other Ambulatory Visit: Payer: Self-pay | Admitting: Medical Oncology

## 2013-10-02 DIAGNOSIS — C7951 Secondary malignant neoplasm of bone: Secondary | ICD-10-CM

## 2013-10-02 NOTE — Progress Notes (Signed)
  Radiation Oncology         [phone] ________________________________  Name: [name] MRN: [id number]  Date: 10/02/2013  DOB: January 02, 1929  Simulation Verification Note   NARRATIVE: The patient was brought to the treatment unit and placed in the planned treatment position. The clinical setup was verified. Then port films were obtained and uploaded to the radiation oncology medical record software.  The treatment beams were carefully compared against the planned radiation fields. The position, location, and shape of the radiation fields was reviewed. The targeted volume of tissue appears to be appropriately covered by the radiation beams. Based on my personal review, I approved the simulation verification. The patient's treatment will proceed as planned.  ________________________________   Jodelle Gross, MD, PhD

## 2013-10-03 ENCOUNTER — Encounter (HOSPITAL_COMMUNITY): Payer: Self-pay

## 2013-10-03 ENCOUNTER — Ambulatory Visit (HOSPITAL_COMMUNITY)
Admission: RE | Admit: 2013-10-03 | Discharge: 2013-10-03 | Disposition: A | Discharge status: Home / Self Care | Payer: Medicare Other | Source: Ambulatory Visit | Attending: Interventional Radiology | Admitting: Interventional Radiology

## 2013-10-03 ENCOUNTER — Ambulatory Visit: Payer: Medicare Other

## 2013-10-03 ENCOUNTER — Ambulatory Visit (HOSPITAL_COMMUNITY)
Admission: RE | Admit: 2013-10-03 | Discharge: 2013-10-03 | Disposition: A | Discharge status: Home / Self Care | Payer: Medicare Other | Source: Ambulatory Visit | Attending: Internal Medicine | Admitting: Internal Medicine

## 2013-10-03 VITALS — BP 142/83 | HR 110 | Temp 97.8°F | Resp 18 | Ht 65.0 in | Wt 164.0 lb

## 2013-10-03 DIAGNOSIS — Z8249 Family history of ischemic heart disease and other diseases of the circulatory system: Secondary | ICD-10-CM | POA: Insufficient documentation

## 2013-10-03 DIAGNOSIS — C7951 Secondary malignant neoplasm of bone: Secondary | ICD-10-CM | POA: Insufficient documentation

## 2013-10-03 DIAGNOSIS — E785 Hyperlipidemia, unspecified: Secondary | ICD-10-CM | POA: Insufficient documentation

## 2013-10-03 DIAGNOSIS — I2510 Atherosclerotic heart disease of native coronary artery without angina pectoris: Secondary | ICD-10-CM | POA: Insufficient documentation

## 2013-10-03 DIAGNOSIS — C3490 Malignant neoplasm of unspecified part of unspecified bronchus or lung: Secondary | ICD-10-CM | POA: Insufficient documentation

## 2013-10-03 DIAGNOSIS — R229 Localized swelling, mass and lump, unspecified: Secondary | ICD-10-CM | POA: Insufficient documentation

## 2013-10-03 DIAGNOSIS — Z79899 Other long term (current) drug therapy: Secondary | ICD-10-CM | POA: Insufficient documentation

## 2013-10-03 DIAGNOSIS — F411 Generalized anxiety disorder: Secondary | ICD-10-CM | POA: Insufficient documentation

## 2013-10-03 DIAGNOSIS — C7952 Secondary malignant neoplasm of bone marrow: Secondary | ICD-10-CM

## 2013-10-03 DIAGNOSIS — Z833 Family history of diabetes mellitus: Secondary | ICD-10-CM | POA: Insufficient documentation

## 2013-10-03 DIAGNOSIS — I10 Essential (primary) hypertension: Secondary | ICD-10-CM | POA: Insufficient documentation

## 2013-10-03 DIAGNOSIS — I739 Peripheral vascular disease, unspecified: Secondary | ICD-10-CM | POA: Insufficient documentation

## 2013-10-03 DIAGNOSIS — Z87891 Personal history of nicotine dependence: Secondary | ICD-10-CM | POA: Insufficient documentation

## 2013-10-03 DIAGNOSIS — C772 Secondary and unspecified malignant neoplasm of intra-abdominal lymph nodes: Secondary | ICD-10-CM | POA: Insufficient documentation

## 2013-10-03 LAB — CBC
HEMATOCRIT: 38.3 % — AB (ref 39.0–52.0)
Hemoglobin: 12.5 g/dL — ABNORMAL LOW (ref 13.0–17.0)
MCH: 27.3 pg (ref 26.0–34.0)
MCHC: 32.6 g/dL (ref 30.0–36.0)
MCV: 83.6 fL (ref 78.0–100.0)
Platelets: 176 10*3/uL (ref 150–400)
RBC: 4.58 MIL/uL (ref 4.22–5.81)
RDW: 14.8 % (ref 11.5–15.5)
WBC: 13.4 10*3/uL — AB (ref 4.0–10.5)

## 2013-10-03 LAB — PROTIME-INR
INR: 1.31 (ref 0.00–1.49)
Prothrombin Time: 16 seconds — ABNORMAL HIGH (ref 11.6–15.2)

## 2013-10-03 LAB — APTT: aPTT: 38 seconds — ABNORMAL HIGH (ref 24–37)

## 2013-10-03 MED ORDER — MIDAZOLAM HCL 2 MG/2ML IJ SOLN
INTRAMUSCULAR | Status: AC | PRN
  Administered 2013-10-03: 1 mg via INTRAVENOUS

## 2013-10-03 MED ORDER — SODIUM CHLORIDE 0.9 % IV SOLN: INTRAVENOUS | Status: DC

## 2013-10-03 MED ORDER — MIDAZOLAM HCL 2 MG/2ML IJ SOLN
INTRAMUSCULAR | Status: AC
  Filled 2013-10-03: qty 4

## 2013-10-03 MED ORDER — FENTANYL CITRATE 0.05 MG/ML IJ SOLN
INTRAMUSCULAR | Status: AC
  Filled 2013-10-03: qty 4

## 2013-10-03 MED ORDER — FENTANYL CITRATE 0.05 MG/ML IJ SOLN
INTRAMUSCULAR | Status: AC | PRN
  Administered 2013-10-03: 100 ug via INTRAVENOUS

## 2013-10-03 NOTE — Progress Notes (Signed)
Called report of CXR to [place], rec. Order to d/c home.

## 2013-10-03 NOTE — Discharge Instructions (Signed)
Biopsy Care After Refer to this sheet in the next few weeks. These instructions provide you with information on caring for yourself after your procedure. Your caregiver may also give you more specific instructions. Your treatment has been planned according to current medical practices, but problems sometimes occur. Call your caregiver if you have any problems or questions after your procedure. If you had a fine needle biopsy, you may have soreness at the biopsy site for 1 to 2 days. If you had an open biopsy, you may have soreness at the biopsy site for 3 to 4 days. HOME CARE INSTRUCTIONS   You may resume normal diet and activities as directed.  Change bandages (dressings) as directed. If your wound was closed with a skin glue (adhesive), it will wear off and begin to peel in 7 days.  Only take over-the-counter or prescription medicines for pain, discomfort, or fever as directed by your caregiver.  Ask your caregiver when you can bathe and get your wound wet. SEEK IMMEDIATE MEDICAL CARE IF:   You have increased bleeding (more than a small spot) from the biopsy site.  You notice redness, swelling, or increasing pain at the biopsy site.  You have pus coming from the biopsy site.  You have a fever.  You notice a bad smell coming from the biopsy site or dressing.  You have a rash, have difficulty breathing, or have any allergic problems. MAKE SURE YOU:   Understand these instructions.  Will watch your condition.  Will get help right away if you are not doing well or get worse. Document Released: 12/24/2004 Document Revised: 08/29/2011 Document Reviewed: 12/02/2010 [facility] Patient Information 2014 [place].

## 2013-10-03 NOTE — H&P (Signed)
Chief Complaint: "I am here for another biopsy." Referring Physician: Dr. Juliann Mule HPI: [name] is an 80 y.o. male with right hip pain and weakness now in a wheelchair because he is unable to put weight on his legs, imaging studies revealed multiple osseous metastases and a RLL lung mass.  He underwent (R)iliac bone biopsy, which confirmed metastatic disease but sample was too necrotic to do molecular testing. He is now scheduled for biopsy to obtain additional tissue for molecular testing. PET reviewed and pt has several abd wall sub-q nodules that are amenable to biopsy. PMHx reviewed.    Past Medical History:  Past Medical History  Diagnosis Date  . CORONARY ARTERY DISEASE 12/26/2006  . HYPERLIPIDEMIA 12/26/2006  . HYPERTENSION 12/26/2006  . PERIPHERAL VASCULAR DISEASE 12/26/2006  . Allergy   . Anxiety   . Arthritis     hands    Past Surgical History:  Past Surgical History  Procedure Laterality Date  . Abdominal aortic aneurysm repair    . Transurethral resection of prostate    . Hernia repair      ingunial  . Cardiac catheterization    . Intramedullary (im) nail intertrochanteric Left 12/03/2012    Procedure: INTRAMEDULLARY (IM) NAIL INTERTROCHANTRIC;  Surgeon: Wylene Simmer, MD;  Location: [facility];  Service: Orthopedics;  Laterality: Left;    Family History:  Family History  Problem Relation Age of Onset  . Diabetes Mother   . Hypertension Mother     Social History:  reports that he quit smoking about 35 years ago. His smoking use included Cigarettes. He smoked 0.50 packs per day. He has never used smokeless tobacco. He reports that he drinks about .6 ounces of alcohol per week. He reports that he does not use illicit drugs.  Allergies:  Allergies  Allergen Reactions  . Cardura [Doxazosin Mesylate] Other (See Comments)    Upset stomach with generic doxazosin only.    Medications:   Medication List    ASK your doctor about these medications        HYDROcodone-acetaminophen 10-325 MG per tablet  Commonly known as:  NORCO  Take 1 tablet by mouth every 6 (six) hours as needed.       Please HPI for pertinent positives, otherwise complete 10 system ROS negative.  Physical Exam: BP 134/79  Pulse 102  Temp(Src) 98.9 F (37.2 C) (Oral)  Resp 22  Ht 5\' 5"  (1.651 m)  Wt 164 lb (74.39 kg)  BMI 27.29 kg/m2  SpO2 94% Body mass index is 27.29 kg/(m^2).  General Appearance:  Alert, cooperative, no distress  Head:  Normocephalic, without obvious abnormality, atraumatic  Neck: Supple, symmetrical, trachea midline  Lungs:   Clear to auscultation bilaterally, no w/r/r, respirations unlabored without use of accessory muscles.  Chest Wall:  No tenderness or deformity  Heart:  Regular rate and rhythm, S1, S2 normal, no murmur, rub or gallop.  Abdomen:   Soft, non-tender, non distended.  Extremities: Extremities normal, atraumatic, no cyanosis or edema  Neurologic: Normal affect, no gross deficits.   Results for orders placed during the hospital encounter of 10/03/13 (from the past 48 hour(s))  APTT     Status: Abnormal   Collection Time    10/03/13 11:05 AM      Result Value Ref Range   aPTT 38 (*) 24 - 37 seconds   Comment:            IF BASELINE aPTT IS ELEVATED,     SUGGEST PATIENT RISK  ASSESSMENT     BE USED TO DETERMINE APPROPRIATE     ANTICOAGULANT THERAPY.  CBC     Status: Abnormal   Collection Time    10/03/13 11:05 AM      Result Value Ref Range   WBC 13.4 (*) 4.0 - 10.5 K/uL   RBC 4.58  4.22 - 5.81 MIL/uL   Hemoglobin 12.5 (*) 13.0 - 17.0 g/dL   HCT 38.3 (*) 39.0 - 52.0 %   MCV 83.6  78.0 - 100.0 fL   MCH 27.3  26.0 - 34.0 pg   MCHC 32.6  30.0 - 36.0 g/dL   RDW 14.8  11.5 - 15.5 %   Platelets 176  150 - 400 K/uL  PROTIME-INR     Status: Abnormal   Collection Time    10/03/13 11:05 AM      Result Value Ref Range   Prothrombin Time 16.0 (*) 11.6 - 15.2 seconds   INR 1.31  0.00 - 1.49   No results  found.  Assessment/Plan RLL lung mass. stage IV NSCLC Request for image guided biopsy of sub-q abd wall nodule for molecular testing Patient has been NPO, labs and images reviewed, no blood thinners taken. Risks and Benefits discussed with the patient. All of the patient's questions were answered, patient is agreeable to proceed. Consent signed and in chart.   Ascencion Dike PA-C 10/03/2013, 12:57 PM

## 2013-10-03 NOTE — Procedures (Signed)
Interventional Radiology Procedure Note  Procedure: [place] core bx right chest wall nodule Complications: None Recommendations: - CXR in 1 hr - Bedrest until CXR clear  Signed,  Criselda Peaches, MD [facility]

## 2013-10-04 ENCOUNTER — Ambulatory Visit
Admission: RE | Admit: 2013-10-04 | Discharge: 2013-10-04 | Disposition: A | Discharge status: Home / Self Care | Payer: Medicare Other | Source: Ambulatory Visit | Attending: Radiation Oncology | Admitting: Radiation Oncology

## 2013-10-04 VITALS — BP 128/68 | HR 102 | Temp 97.5°F

## 2013-10-04 DIAGNOSIS — C7951 Secondary malignant neoplasm of bone: Secondary | ICD-10-CM

## 2013-10-04 MED ORDER — BIAFINE EX EMUL
CUTANEOUS | Status: DC | PRN
  Administered 2013-10-04: 13:00:00 via TOPICAL

## 2013-10-04 NOTE — Progress Notes (Signed)
[name] has had 2 fractions to his right hip, left pelvis and sternum.  He denies pain.  He reports having pain when he moves.  He is taking norco 1 tablet q 6 hours.  He reports having liquid stools for the last 4 weeks.  His daughter reports that he has had rectal bleeding.  He denies bladder changes.  He reports not being able to walk without assistance and was not able to stand for a weight today.  He reports a poor appetite and is not eating very much.  He started drinking boost today.  He denies a cough.  He reports fatigue.

## 2013-10-04 NOTE — Progress Notes (Signed)
   Department of Radiation Oncology  Phone:  [phone] Fax:        [phone]  Weekly Treatment Note    Name: [name] Date: 10/04/2013 MRN: [id number] DOB: May 16, 1929   Current dose: 6 Gy  Current fraction: 2   MEDICATIONS: Current Outpatient Prescriptions  Medication Sig Dispense Refill  . HYDROcodone-acetaminophen (NORCO) 10-325 MG per tablet Take 1 tablet by mouth every 6 (six) hours as needed.  60 tablet  0  . [START ON 10/07/2013] emollient (BIAFINE) cream Apply 1 application topically 2 (two) times daily. Apply to affected skin areas after rad tx and bedtime      . furosemide (LASIX) 20 MG tablet        No current facility-administered medications for this encounter.   Facility-Administered Medications Ordered in Other Encounters  Medication Dose Route Frequency Provider Last Rate Last Dose  . topical emolient (BIAFINE) emulsion   Topical PRN Marye Round, MD         ALLERGIES: Cardura   LABORATORY DATA:  Lab Results  Component Value Date   WBC 13.4* 10/03/2013   HGB 12.5* 10/03/2013   HCT 38.3* 10/03/2013   MCV 83.6 10/03/2013   PLT 176 10/03/2013   Lab Results  Component Value Date   NA 143 09/11/2013   K 3.4* 09/11/2013   CL 101 07/08/2013   CO2 23 09/11/2013   Lab Results  Component Value Date   ALT 35 09/11/2013   AST 26 09/11/2013   ALKPHOS 89 09/11/2013   BILITOT 0.71 09/11/2013     NARRATIVE: [name] was seen today for weekly treatment management. The chart was checked and the patient's films were reviewed. The patient is doing well with treatment and his first week. No new complaints. No major change in pain so far.  PHYSICAL EXAMINATION: temperature is 97.5 F (36.4 C). His blood pressure is 128/68 and his pulse is 102. His oxygen saturation is 94%.        ASSESSMENT: The patient is doing satisfactorily with treatment.  PLAN: We will continue with the patient's radiation treatment as planned.

## 2013-10-04 NOTE — Progress Notes (Signed)
Patient here with his daughter.  He was given the Radiation Therapy and You book and discussed potential side effects/management of diarrhea, fatigue, skin changes, nausea and throat changes.  He was given Biafine cream and was instructed to apply it to his chest twice a day after treatment and at bedtime.  He was educated about under treat day with Dr. Lisbeth Renshaw on Friday's.  He was given [facility] business card and was instructed to call with any questions or concerns.

## 2013-10-04 NOTE — Progress Notes (Signed)
  Radiation Oncology         [phone] ________________________________  Name: [name] MRN: [id number]  Date: 09/27/2013  DOB: 05/08/1929  SIMULATION AND TREATMENT PLANNING NOTE  DIAGNOSIS:  Metastatic adenocarcinoma  Site:   1. the right hip 2. left pelvis 3. sternum  NARRATIVE:  The patient was brought to the [place].  Identity was confirmed.  All relevant records and images related to the planned course of therapy were reviewed.   Written consent to proceed with treatment was confirmed which was freely given after reviewing the details related to the planned course of therapy had been reviewed with the patient.  Then, the patient was set-up in a stable reproducible  supine position for radiation therapy.  CT images were obtained.  Surface markings were placed.    Medically necessary complex treatment device(s) for immobilization:  Customized VAC lock bag.   The CT images were loaded into the planning software.  Then the target and avoidance structures were contoured.  Treatment planning then occurred.  The radiation prescription was entered and confirmed.  A total of 8 complex treatment devices were fabricated which relate to the designed radiation treatment fields: 4 customized fields for the right hip, 3 customized fields for the left pelvis, and 1 customized en face electron field for the sternum. Each of these customized fields/ complex treatment devices will be used on a daily basis during the radiation course. I have requested : Complex isodose plan.   PLAN:  The patient will receive 30 Gy in 10 fractions to each of these 3 target treatment areas..  ________________________________   Jodelle Gross, MD, PhD

## 2013-10-07 ENCOUNTER — Ambulatory Visit
Admission: RE | Admit: 2013-10-07 | Discharge: 2013-10-07 | Disposition: A | Discharge status: Home / Self Care | Payer: Medicare Other | Source: Ambulatory Visit | Attending: Radiation Oncology | Admitting: Radiation Oncology

## 2013-10-08 ENCOUNTER — Ambulatory Visit
Admission: RE | Admit: 2013-10-08 | Discharge: 2013-10-08 | Disposition: A | Discharge status: Home / Self Care | Payer: Medicare Other | Source: Ambulatory Visit | Attending: Radiation Oncology | Admitting: Radiation Oncology

## 2013-10-09 ENCOUNTER — Telehealth: Payer: Self-pay | Admitting: Internal Medicine

## 2013-10-09 ENCOUNTER — Ambulatory Visit
Admission: RE | Admit: 2013-10-09 | Discharge: 2013-10-09 | Disposition: A | Discharge status: Home / Self Care | Payer: Medicare Other | Source: Ambulatory Visit | Attending: Radiation Oncology | Admitting: Radiation Oncology

## 2013-10-09 NOTE — Telephone Encounter (Signed)
Pamala Hurry from [facility] called to req orders for pt he has  develop stage 3 sacral pressure ulcer and is requestiing wound orders   sice  4 by 3 on the large one and 2 by 1 on the smaller one  Phone number  [phone]  Fax number  [phone]

## 2013-10-10 ENCOUNTER — Ambulatory Visit (HOSPITAL_BASED_OUTPATIENT_CLINIC_OR_DEPARTMENT_OTHER): Payer: Medicare Other | Admitting: Internal Medicine

## 2013-10-10 ENCOUNTER — Other Ambulatory Visit: Payer: Self-pay | Admitting: Internal Medicine

## 2013-10-10 ENCOUNTER — Telehealth: Payer: Self-pay | Admitting: Internal Medicine

## 2013-10-10 ENCOUNTER — Other Ambulatory Visit (HOSPITAL_BASED_OUTPATIENT_CLINIC_OR_DEPARTMENT_OTHER): Payer: Medicare Other

## 2013-10-10 ENCOUNTER — Ambulatory Visit
Admission: RE | Admit: 2013-10-10 | Discharge: 2013-10-10 | Disposition: A | Discharge status: Home / Self Care | Payer: Medicare Other | Source: Ambulatory Visit | Attending: Radiation Oncology | Admitting: Radiation Oncology

## 2013-10-10 VITALS — BP 105/70 | HR 108 | Temp 98.2°F | Resp 18 | Ht 65.0 in

## 2013-10-10 DIAGNOSIS — C3430 Malignant neoplasm of lower lobe, unspecified bronchus or lung: Secondary | ICD-10-CM

## 2013-10-10 DIAGNOSIS — C7952 Secondary malignant neoplasm of bone marrow: Secondary | ICD-10-CM

## 2013-10-10 DIAGNOSIS — E876 Hypokalemia: Secondary | ICD-10-CM

## 2013-10-10 DIAGNOSIS — M25559 Pain in unspecified hip: Secondary | ICD-10-CM

## 2013-10-10 DIAGNOSIS — L89109 Pressure ulcer of unspecified part of back, unspecified stage: Secondary | ICD-10-CM

## 2013-10-10 DIAGNOSIS — C3490 Malignant neoplasm of unspecified part of unspecified bronchus or lung: Secondary | ICD-10-CM

## 2013-10-10 DIAGNOSIS — C7951 Secondary malignant neoplasm of bone: Secondary | ICD-10-CM

## 2013-10-10 DIAGNOSIS — L8990 Pressure ulcer of unspecified site, unspecified stage: Secondary | ICD-10-CM

## 2013-10-10 LAB — COMPREHENSIVE METABOLIC PANEL (CC13)
ALBUMIN: 2.6 g/dL — AB (ref 3.5–5.0)
ALT: 34 U/L (ref 0–55)
AST: 35 U/L — ABNORMAL HIGH (ref 5–34)
Alkaline Phosphatase: 82 U/L (ref 40–150)
Anion Gap: 13 mEq/L — ABNORMAL HIGH (ref 3–11)
BILIRUBIN TOTAL: 1.06 mg/dL (ref 0.20–1.20)
BUN: 17 mg/dL (ref 7.0–26.0)
CHLORIDE: 100 meq/L (ref 98–109)
CO2: 27 mEq/L (ref 22–29)
Calcium: 9.5 mg/dL (ref 8.4–10.4)
Creatinine: 0.8 mg/dL (ref 0.7–1.3)
GLUCOSE: 121 mg/dL (ref 70–140)
Potassium: 2.4 mEq/L — CL (ref 3.5–5.1)
SODIUM: 141 meq/L (ref 136–145)
Total Protein: 6 g/dL — ABNORMAL LOW (ref 6.4–8.3)

## 2013-10-10 LAB — CBC WITH DIFFERENTIAL/PLATELET
BASO%: 0.5 % (ref 0.0–2.0)
Basophils Absolute: 0 10*3/uL (ref 0.0–0.1)
EOS ABS: 0.1 10*3/uL (ref 0.0–0.5)
EOS%: 0.8 % (ref 0.0–7.0)
HEMATOCRIT: 36.2 % — AB (ref 38.4–49.9)
HGB: 11.8 g/dL — ABNORMAL LOW (ref 13.0–17.1)
LYMPH#: 0.9 10*3/uL (ref 0.9–3.3)
LYMPH%: 8.8 % — AB (ref 14.0–49.0)
MCH: 26.7 pg — ABNORMAL LOW (ref 27.2–33.4)
MCHC: 32.5 g/dL (ref 32.0–36.0)
MCV: 82.2 fL (ref 79.3–98.0)
MONO#: 0.8 10*3/uL (ref 0.1–0.9)
MONO%: 8.3 % (ref 0.0–14.0)
NEUT#: 8.2 10*3/uL — ABNORMAL HIGH (ref 1.5–6.5)
NEUT%: 81.6 % — ABNORMAL HIGH (ref 39.0–75.0)
PLATELETS: 190 10*3/uL (ref 140–400)
RBC: 4.41 10*6/uL (ref 4.20–5.82)
RDW: 15.3 % — ABNORMAL HIGH (ref 11.0–14.6)
WBC: 10.1 10*3/uL (ref 4.0–10.3)

## 2013-10-10 MED ORDER — POTASSIUM CHLORIDE CRYS ER 20 MEQ PO TBCR: 20.0000 meq | EXTENDED_RELEASE_TABLET | Freq: Every day | ORAL | Status: AC

## 2013-10-10 MED ORDER — POTASSIUM CHLORIDE CRYS ER 20 MEQ PO TBCR
60.0000 meq | EXTENDED_RELEASE_TABLET | Freq: Once | ORAL | Status: AC
  Administered 2013-10-10: 60 meq via ORAL

## 2013-10-10 NOTE — Telephone Encounter (Signed)
What does [facility] suggest?

## 2013-10-10 NOTE — Patient Instructions (Signed)
Hypokalemia Hypokalemia means that the amount of potassium in the blood is lower than normal.Potassium is a chemical, called an electrolyte, that helps regulate the amount of fluid in the body. It also stimulates muscle contraction and helps nerves function properly.Most of the body's potassium is inside of cells, and only a very small amount is in the blood. Because the amount in the blood is so small, minor changes can be life-threatening. CAUSES  Antibiotics.  Diarrhea or vomiting.  Using laxatives too much, which can cause diarrhea.  Chronic kidney disease.  Water pills (diuretics).  Eating disorders (bulimia).  Low magnesium level.  Sweating a lot. SIGNS AND SYMPTOMS  Weakness.  Constipation.  Fatigue.  Muscle cramps.  Mental confusion.  Skipped heartbeats or irregular heartbeat (palpitations).  Tingling or numbness. DIAGNOSIS  Your health care provider can diagnose hypokalemia with blood tests. In addition to checking your potassium level, your health care provider may also check other lab tests. TREATMENT Hypokalemia can be treated with potassium supplements taken by mouth or adjustments in your current medicines. If your potassium level is very low, you may need to get potassium through a vein (IV) and be monitored in the hospital. A diet high in potassium is also helpful. Foods high in potassium are:  Nuts, such as peanuts and pistachios.  Seeds, such as sunflower seeds and pumpkin seeds.  Peas, lentils, and lima beans.  Whole grain and bran cereals and breads.  Fresh fruit and vegetables, such as apricots, avocado, bananas, cantaloupe, kiwi, oranges, tomatoes, asparagus, and potatoes.  Orange and tomato juices.  Red meats.  Fruit yogurt. HOME CARE INSTRUCTIONS  Take all medicines as prescribed by your health care provider.  Maintain a healthy diet by including nutritious food, such as fruits, vegetables, nuts, whole grains, and lean meats.  If  you are taking a laxative, be sure to follow the directions on the label. SEEK MEDICAL CARE IF:  Your weakness gets worse.  You feel your heart pounding or racing.  You are vomiting or having diarrhea.  You are diabetic and having trouble keeping your blood glucose in the normal range. SEEK IMMEDIATE MEDICAL CARE IF:  You have chest pain, shortness of breath, or dizziness.  You are vomiting or having diarrhea for more than 2 days.  You faint. MAKE SURE YOU:   Understand these instructions.  Will watch your condition.  Will get help right away if you are not doing well or get worse. Document Released: 06/06/2005 Document Revised: 03/27/2013 Document Reviewed: 12/07/2012 [employer] Patient Information 2014 [employer].  

## 2013-10-10 NOTE — Telephone Encounter (Signed)
Please see message and advise 

## 2013-10-10 NOTE — Telephone Encounter (Signed)
gve the pt's dtr the April,may 2015 appt calendar.

## 2013-10-10 NOTE — Progress Notes (Signed)
[facility] OFFICE PROGRESS NOTE  Nyoka Cowden, MD [place] [address]  DIAGNOSIS: Bone metastases - Plan: CBC with Differential, Basic metabolic panel (Bmet) - [facility]  Hypokalemia - Plan: Basic metabolic panel (Bmet) - [facility]  Chief Complaint  Patient presents with  . Metastatic lung cancer (metastasis from lung to other site)    CURRENT TREATMENT:  Palliative XRT to the R Hip per Dr. Lisbeth Renshaw.   INTERVAL HISTORY: [name] 81 y.o. 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  He was last seen by PA Johnson on 09/23/2013.   The patient has been followed by [facility] (Dr. Vickki Hearing on 08/26/2013) for hips and anterior thigh pain over the past one month. Symptoms included pain described as aching and throbbing with stiffness and aggravated with weightbearing. He has had difficulty with ambulating due to pain. He has been taking Norco 5-325 mg one tablet every 6 hours for the pain. He denies any additional bone pain. He denies hematuria or hematochezia. He denies weight lost. He reports stool changes over the past few weeks described as loose with "wheat flakes" consistency. He has never had a colonoscopy. He denies abdominal pain. He also denies chest pain.   Dr. Delilah Shan ordered an MRI of his right hip without contrast which revealed multiple osseous masses as described above with the 2 largest areas located in the left superior ilium and right anterior acetabulum/ superior pubic ramus. Differential diagnoses included metastatic disease versus multiple myeloma. There was also right gluteus medius muscle strain with partial tear at the trochanteric insertion.  He lives with in [place]. He does endorse a smoking history but stopped at age 32 years old. He smoked for 30 plus pack years prior to quitting. His PCP ordered a chest xray  (report not reviewed). He reports that he stopped taking his medications for his blood pressure and focuses mainly on taking his pain medications.   He has been extensively imaged in an effort to diagnosis the cause of his extensive bony metastasis, including a PET scan from the skull base to thigh. He underwent a recent bone biopsy of the posterior left iliac bone. The specimen was largely necrotic but was positive for adenocarcinoma. As the specimen is almost entirely necrotic per [facility] labs the specimen is not the best sample for molecular testing and the results are not guaranteed. The Accession number is [id number]. When the patient presented with such extensive osseous disease was initially thought related to multiple myeloma however his multiple myeloma workup was negative. The PET scan from 09/23/2013 revealed widespread metastatic lung cancer with hypermetabolic mediastinal and right hilar adenopathy, right lower lobe masslike consolidation, right pleural nodularity, left upper quadrant of omental nodule, a subcutaneous soft tissue nodules in multifocal osseous metastatic disease.    Today, he reports doing fine. He is accommpanied by his daughter [name]. He had U/S core biopsy by Dr. Laurence Ferrari for molecular testing on 10/03/2013.  He is receiving fractions of XRT to his right hip, left pelvis and sternum.  Tomorrow will be his sixth treatment of a planned 10 treatments. His last treatment will be April 29th.   Pt he has develop stage 3 sacral pressure ulcer and is requestiing wound orders sice 4 by 3 on the large one and 2 by 1 on the smaller one.  He is being followed by the nurse  at [facility].  He is sitting on egg shell crates.     MEDICAL HISTORY: Past Medical History  Diagnosis Date  . CORONARY ARTERY DISEASE 12/26/2006  . HYPERLIPIDEMIA 12/26/2006  . HYPERTENSION 12/26/2006  . PERIPHERAL VASCULAR DISEASE 12/26/2006  . Allergy   . Anxiety   . Arthritis     hands     INTERIM HISTORY: has HYPERLIPIDEMIA; HYPERTENSION; CORONARY ARTERY DISEASE; PERIPHERAL VASCULAR DISEASE; Intertrochanteric fracture of left femur; and Bone metastases on his problem list.    ALLERGIES:  is allergic to cardura.  MEDICATIONS: has a current medication list which includes the following prescription(s): emollient, hydrocodone-acetaminophen, and potassium chloride sa.  SURGICAL HISTORY:  Past Surgical History  Procedure Laterality Date  . Abdominal aortic aneurysm repair    . Transurethral resection of prostate    . Hernia repair      ingunial  . Cardiac catheterization    . Intramedullary (im) nail intertrochanteric Left 12/03/2012    Procedure: INTRAMEDULLARY (IM) NAIL INTERTROCHANTRIC;  Surgeon: Wylene Simmer, MD;  Location: [facility];  Service: Orthopedics;  Laterality: Left;    REVIEW OF SYSTEMS:   Constitutional: Denies fevers, chills or abnormal weight loss Eyes: Denies blurriness of vision Ears, nose, mouth, throat, and face: Denies mucositis or sore throat Respiratory: Denies cough, dyspnea or wheezes Cardiovascular: Denies palpitation, chest discomfort or lower extremity swelling Gastrointestinal:  Denies nausea, heartburn or change in bowel habits Skin: Denies abnormal skin rashes Lymphatics: Denies new lymphadenopathy or easy bruising Neurological:Denies numbness, tingling or new weaknesses Behavioral/Psych: Mood is stable, no new changes  All other systems were reviewed with the patient and are negative.  PHYSICAL EXAMINATION: ECOG PERFORMANCE STATUS: 2 - Symptomatic, <50% confined to bed  Blood pressure 105/70, pulse 108, temperature 98.2 F (36.8 C), temperature source Oral, resp. rate 18, height _0  (1.651 m), weight 0 lb (0 kg), SpO2 98.00%.  GENERAL:alert, no distress and comfortable; chronically ill appearing.  Sitting in wheelchair; HOH SKIN: skin color, texture, turgor are normal, no rashes or significant lesions; (Reported stage III ulcer per  nursing, not examined).  EYES: normal, Conjunctiva are pink and non-injected, sclera clear OROPHARYNX:no exudate, no erythema and lips, buccal mucosa, and tongue normal  NECK: supple, thyroid normal size, non-tender, without nodularity LYMPH:  no palpable lymphadenopathy in the cervical, axillary or supraclavicular LUNGS: clear to auscultation with normal breathing effort, no wheezes or rhonchi HEART: regular rate & rhythm and no murmurs and no lower extremity edema ABDOMEN:abdomen soft, non-tender and normal bowel sounds; Large hernia.   Musculoskeletal:no cyanosis of digits and no clubbing  NEURO: alert & oriented x 3 with fluent speech, no focal motor/sensory deficits  Labs:  Lab Results  Component Value Date   WBC 10.1 10/10/2013   HGB 11.8* 10/10/2013   HCT 36.2* 10/10/2013   MCV 82.2 10/10/2013   PLT 190 10/10/2013   NEUTROABS 8.2* 10/10/2013      Chemistry      Component Value Date/Time   NA 141 10/10/2013 1508   NA 140 07/08/2013 0940   K 2.4* 10/10/2013 1508   K 3.2* 07/08/2013 0940   CL 101 07/08/2013 0940   CO2 27 10/10/2013 1508   CO2 31 07/08/2013 0940   BUN 17.0 10/10/2013 1508   BUN 23 07/08/2013 0940   CREATININE 0.8 10/10/2013 1508   CREATININE 1.1 07/08/2013 0940      Component Value Date/Time   CALCIUM 9.5 10/10/2013 1508   CALCIUM 9.1 07/08/2013 0940   ALKPHOS 82 10/10/2013  1508   ALKPHOS 70 07/08/2013 0940   AST 35* 10/10/2013 1508   AST 18 07/08/2013 0940   ALT 34 10/10/2013 1508   ALT 14 07/08/2013 0940   BILITOT 1.06 10/10/2013 1508   BILITOT 0.8 07/08/2013 0940       Basic Metabolic Panel:  Recent Labs Lab 10/10/13 1508  NA 141  K 2.4*  CO2 27  GLUCOSE 121  BUN 17.0  CREATININE 0.8  CALCIUM 9.5   GFR The CrCl is unknown because both a height and weight (above a minimum accepted value) are required for this calculation. Liver Function Tests:  Recent Labs Lab 10/10/13 1508  AST 35*  ALT 34  ALKPHOS 82  BILITOT 1.06  PROT 6.0*  ALBUMIN 2.6*    No results found for this basename: LIPASE, AMYLASE,  in the last 168 hours No results found for this basename: AMMONIA,  in the last 168 hours Coagulation profile No results found for this basename: INR, PROTIME,  in the last 168 hours  CBC:  Recent Labs Lab 10/10/13 1508  WBC 10.1  NEUTROABS 8.2*  HGB 11.8*  HCT 36.2*  MCV 82.2  PLT 190    Anemia work up No results found for this basename: VITAMINB12, FOLATE, FERRITIN, TIBC, IRON, RETICCTPCT,  in the last 72 hours  Studies:  No results found.   RADIOGRAPHIC STUDIES: Dg Chest 1 View  10/03/2013   CLINICAL DATA:  Status post right abdominal wall biopsy.  EXAM: CHEST - 1 VIEW  COMPARISON:  Multiple priors.  FINDINGS: Borderline cardiomegaly. Mild vascular congestion. No pneumothorax. No pleural effusion. No destructive osseous lesion of the thoracic vertebrae or ribs. Medial right hemithorax mass/opacity difficult to visualize on chest x-ray.  IMPRESSION: No pneumothorax post abdominal wall biopsy.  Biopsy.   Electronically Signed   By: Rolla Flatten M.D.   On: 10/03/2013 15:05   Ct Chest W Contrast  09/11/2013   CLINICAL DATA:  Bone metastasis  EXAM: CT CHEST, ABDOMEN, AND PELVIS WITH CONTRAST  TECHNIQUE: Multidetector CT imaging of the chest, abdomen and pelvis was performed following the standard protocol during bolus administration of intravenous contrast.  CONTRAST:  73m OMNIPAQUE IOHEXOL 300 MG/ML SOLN, 1080mOMNIPAQUE IOHEXOL 300 MG/ML SOLN  COMPARISON:  None.  FINDINGS: CT CHEST FINDINGS  Within the right lower lobe there is a consolidative mass measuring 2.5 x 5.5 cm in the medial aspect of the right lower lobe. There is enlarged right hilar lymph node measuring 20 mm. There are large right lower paratracheal lymph nodes measuring 1.5 cm short axis (image 20. There is a subcarinal lymph node measuring 15 mm short axis. Small high right paratracheal lymph nodes are also suspicious for metastasis. No clear evidence of  supraclavicular adenopathy. No axillary adenopathy.  This evidence of bone metastasis with a expansile lesion within the right anterior rib (image 28). There is a aggressive lesion within the upper sternum (image 27.  CT ABDOMEN AND PELVIS FINDINGS  No focal hepatic lesion. The gallbladder, pancreas, spleen, and adrenal glands are normal. Bilateral nonobstructing renal calculi. There is a nonenhancing cysts within the left kidney. Superior to the right kidney there is a nodular lesion measuring 14 mm adjacent to the posterior medial right hepatic lobe (image 51).  Stomach, small bowel, colon are unremarkable there is a ventral hernia which contains a loop sigmoid colon without evidence obstruction. There is moderate volume stool in the transverse colon.  The distal rectum is circumferentially thickened to 17 mm. No pelvic  lymphadenopathy. No retroperitoneal periportal lymphadenopathy.  Large lytic lesion in the posterior left iliac bone with soft tissue expansion measuring 4.1 x 4.1 cm (image 87, series 2).  All lesion in the right iliac wing (image 81). Larger lesion within the right ischium involving the acetabulum with a potential pathologic fracture.  Subcutaneous lesion along the anterior right chest wall at the costal margin measuring 15 mm (image 60).  IMPRESSION: 1. Consolidative mass in the right lower lobe is concerning for primary lung cancer. Consider small cell lung cancer. 2. Right paratracheal and subcarinal adenopathy is consistent with mediastinal nodal metastasis. 3. Skeletal metastasis involving the right ribs, sternum, and large expansile lesions in the pelvis. 4. Retroperitoneal nodular lesion in the right suprarenal location is concerning for metastasis. 5. Diffuse thickening of the rectum. Cannot exclude excreted rectal carcinoma as etiology of metastasis although less favored. 6. Subcutaneous nodule along the right chest wall is indeterminate but again cannot exclude metastasis. 7. Due to the  multitude of findings, an FDG PET scan may complete staging although this may not be necessary. 8. Ventral hernia contains a loop of sigmoid colon.   Electronically Signed   By: Suzy Bouchard M.D.   On: 09/11/2013 16:27   Ct Abdomen Pelvis W Contrast  09/13/2013   CLINICAL DATA:  Bone metastasis  EXAM: CT CHEST, ABDOMEN, AND PELVIS WITH CONTRAST  TECHNIQUE: Multidetector CT imaging of the chest, abdomen and pelvis was performed following the standard protocol during bolus administration of intravenous contrast.  CONTRAST:  17m OMNIPAQUE IOHEXOL 300 MG/ML SOLN, 1028mOMNIPAQUE IOHEXOL 300 MG/ML SOLN  COMPARISON:  None.  FINDINGS: CT CHEST FINDINGS  Within the right lower lobe there is a consolidative mass measuring 2.5 x 5.5 cm in the medial aspect of the right lower lobe. There is enlarged right hilar lymph node measuring 20 mm. There are large right lower paratracheal lymph nodes measuring 1.5 cm short axis (image 20. There is a subcarinal lymph node measuring 15 mm short axis. Small high right paratracheal lymph nodes are also suspicious for metastasis. No clear evidence of supraclavicular adenopathy. No axillary adenopathy.  This evidence of bone metastasis with a expansile lesion within the right anterior rib (image 28). There is a aggressive lesion within the upper sternum (image 27.  CT ABDOMEN AND PELVIS FINDINGS  No focal hepatic lesion. The gallbladder, pancreas, spleen, and adrenal glands are normal. Bilateral nonobstructing renal calculi. There is a nonenhancing cysts within the left kidney. Superior to the right kidney there is a nodular lesion measuring 14 mm adjacent to the posterior medial right hepatic lobe (image 51).  Stomach, small bowel, colon are unremarkable there is a ventral hernia which contains a loop sigmoid colon without evidence obstruction. There is moderate volume stool in the transverse colon.  The distal rectum is circumferentially thickened to 17 mm. No pelvic  lymphadenopathy. No retroperitoneal periportal lymphadenopathy.  Large lytic lesion in the posterior left iliac bone with soft tissue expansion measuring 4.1 x 4.1 cm (image 87, series 2).  All lesion in the right iliac wing (image 81). Larger lesion within the right ischium involving the acetabulum with a potential pathologic fracture.  Subcutaneous lesion along the anterior right chest wall at the costal margin measuring 15 mm (image 60).  IMPRESSION: 1. Consolidative mass in the right lower lobe is concerning for primary lung cancer. Consider small cell lung cancer. 2. Right paratracheal and subcarinal adenopathy is consistent with mediastinal nodal metastasis. 3. Skeletal metastasis involving the right ribs,  sternum, and large expansile lesions in the pelvis. 4. Retroperitoneal nodular lesion in the right suprarenal location is concerning for metastasis. 5. Diffuse thickening of the rectum. Cannot exclude excreted rectal carcinoma as etiology of metastasis although less favored. 6. Subcutaneous nodule along the right chest wall is indeterminate but again cannot exclude metastasis. 7. Due to the multitude of findings, an FDG PET scan may complete staging although this may not be necessary. 8. Ventral hernia contains a loop of sigmoid colon.   Electronically Signed   By: Suzy Bouchard M.D.   On: 09/13/2013 09:04   Nm Pet Image Initial (pi) Skull Base To Thigh  09/23/2013   CLINICAL DATA:  Initial treatment strategy for small cell lung cancer with bone metastases.  EXAM: NUCLEAR MEDICINE PET SKULL BASE TO THIGH  TECHNIQUE: 7.7 mCi F-18 FDG was injected intravenously. Full-ring PET imaging was performed from the skull base to thigh after the radiotracer. CT data was obtained and used for attenuation correction and anatomic localization.  FASTING BLOOD GLUCOSE:  Value: 127 mg/dl  COMPARISON:  CT CHEST W/CM dated 09/11/2013  FINDINGS: NECK  No hypermetabolic lymph nodes in the neck. CT images show no acute  findings.  CHEST  Hypermetabolic lymph nodes extend from the low right internal jugular station into the right paratracheal station, subcarinal station and right hilum. Index low right paratracheal lymph nodes measure up to 1.5 cm with an SUV max of 5.9 (CT image 55 and PET image 56). Right hilar adenopathy is better measured on 09/11/2013 (2.0 cm) with an SUV max of approximately 7.6 (PET image 65). It is contiguous with masslike consolidation in the right lower lobe, which has an SUV max of 7.3. A pleural nodule along the anterior margin of the right middle lobe (CT image 68) has an SUV max of 6.8 (PET image 68).  There are scattered hypermetabolic subcutaneous soft tissue nodules with an index lesion in the lateral right paraspinal musculature measuring up to 1.3 x 1.8 cm (posterior to the right scapula, CT image 33), with an SUV max of 5.4 (PET image 33).  CT images show tiny amount of right pleural fluid, new. No pericardial effusion. There are new and enlarging pulmonary nodules. Index right upper lobe nodule measures 5 mm (image 16), previously 2 mm. Largest nodule is seen in the left lower lobe, measuring 9 mm (image 50), new or enlarged. These do not show definite hypermetabolism better 2 2 small for PET resolution.  ABDOMEN/PELVIS  An omental nodule in the left upper quadrant measures 1.1 x 1.6 cm (CT image 90), with an SUV max of 3.5 (PET image 90). A 1.3 cm nodule in the right suprarenal fossa (CT image 91) does not appear hypermetabolic. Additional hypermetabolic subcutaneous soft tissue nodules are again noted. No abnormal hypermetabolism in the liver, adrenal glands, spleen or pancreas.  CT images show the liver, gallbladder and adrenal glands to be grossly unremarkable. Stones are seen in the right kidney. A 3.4 cm low-attenuation lesion in the upper pole left kidney is unchanged. Interval migration of a left renal stone or stones to the left ureterovesical junction/orifice, measuring 5 mm,  collectively (CT image 166). Mild left hydronephrosis is seen in association. Spleen, pancreas, stomach and small bowel are grossly unremarkable. There is a midline ventral hernia containing unobstructed colon. A fair amount of stool is seen in the colon. Previously seen rectal wall thickening appears to have resolved in the interval. Presacral edema persists.  A ventral midline pelvic wall hernia  contains unobstructed small bowel. Small left inguinal hernia contains fat. Atherosclerotic calcification of the arterial vasculature. Postoperative changes in the infrarenal aorta.  SKELETON  There are several hypermetabolic osseous metastases within the spine, sternum, ribs and pelvis. Index mass in the medial left iliac wing measures 4.3 x 5.0 cm with an SUV max of 7.8. Postoperative changes in the proximal left femur.  IMPRESSION: 1. Widespread metastatic lung cancer with hypermetabolic mediastinal and right hilar adenopathy, right lower lobe masslike consolidation, right pleural nodularity, left upper quadrant omental nodule, subcutaneous soft tissue nodules and multifocal osseous metastatic disease. 2. New and enlarging bilateral pulmonary nodules which are too small for PET resolution. 3. Tiny right pleural effusion, new. 4. New left hydronephrosis secondary to a 5 mm stone at the left ureterovesical junction/orifice. These results will be called to the ordering clinician or representative by the Radiologist Assistant, and communication documented in the PACS Dashboard. 5. Coronary artery calcification. 6. Right nephrolithiasis. 7. Ventral hernias contain nonobstructed bowel.   Electronically Signed   By: Lorin Picket M.D.   On: 09/23/2013 09:35   US Biopsy  10/03/2013   CLINICAL DATA:  80 year old male with widely metastatic adenocarcinoma of a likely lung primary. Prior biopsy was limited by extensive necrotic tissue. Additional sampling is requested to facilitate genetic/molecular testing.  EXAM: ULTRASOUND  BIOPSY CORE LIVER  Date: 10/03/2013  PROCEDURE: 1. Ultrasound-guided core-biopsy Interventional Radiologist:  Criselda Peaches, MD  ANESTHESIA/SEDATION: Moderate (conscious) sedation was used. 1 mg Versed, 100 mcg Fentanyl were administered intravenously. The patient's vital signs were monitored continuously by radiology nursing throughout the procedure.  Sedation Time: 10 minutes  TECHNIQUE: Informed consent was obtained from the patient following explanation of the procedure, risks, benefits and alternatives. The patient understands, agrees and consents for the procedure. All questions were addressed. A time out was performed.  The right lateral chest wall was interrogated with ultrasound. A 2 cm subcutaneous nodule was successfully identified. An appropriate skin entry site was selected and marked. The region was sterilely prepped and draped in the usual fashion using Betadine skin prep. Anesthesia was attained by infiltration with 1% lidocaine. A small dermatotomy was made. Under real-time sonographic guidance, several 18 gauge core biopsies were coaxially obtained using the bio Pince automated biopsy device. Biopsy specimens were placed in formalin and sent to pathology for further analysis.  The patient tolerated the procedure well. There was no immediate complication.  IMPRESSION: Technically successful ultrasound-guided core biopsy of subcutaneous right lateral chest wall nodule.  Signed,  Criselda Peaches, MD  [facility]  [facility]   Electronically Signed   By: Jacqulynn Cadet M.D.   On: 10/03/2013 17:41   Ct Biopsy  09/19/2013   CLINICAL DATA:  Right lower lung mass and lytic bone lesions of right ribs, sternum, right hip, right pelvis and left posterior iliac bone. The patient presents for biopsy. The largest destructive lesion is within the posterior left iliac bone.  EXAM: CT GUIDED CORE BIOPSY OF LEFT ILIAC BONE  ANESTHESIA/SEDATION: 2.0  Mg IV  Versed; 50 mcg IV Fentanyl  Total Moderate Sedation Time: 10 minutes.  PROCEDURE: The procedure risks, benefits, and alternatives were explained to the patient. Questions regarding the procedure were encouraged and answered. The patient understands and consents to the procedure.  The left gluteal region was prepped with Betadine in a sterile fashion, and a sterile drape was applied covering the operative field. A sterile gown and sterile gloves were used for the  procedure. Local anesthesia was provided with 1% Lidocaine.  CT was performed in a prone position. After localizing a posterior left iliac bone mass, a 17 gauge needle was advanced under CT fluoroscopic guidance to the level of the mass. Four separate 18 gauge core biopsy samples were obtained and submitted in formalin.  Complications: None  FINDINGS: Large destructive mass of the posterior left iliac bone is localized and has likely increased in size since prior imaging with greatest diameter of approximately 5 cm. Solid tissue was obtained.  IMPRESSION: CT-guided core biopsy performed of the destructive left posterior iliac bone lesion.   Electronically Signed   By: Aletta Edouard M.D.   On: 09/19/2013 15:58   PATHOLOGY: 09/19/2013    Addendum: Per [facility], as the specimen is almost entirely necrotic, the specimen is not the best sample for molecular testing and the results are not guaranteed. This comment is relayed to Dr. Concha Norway on 09/24/13. (RAH:gt, 10/04/13) Willeen Niece MD Pathologist, Electronic Signature ( Signed 09/24/2013) FINAL DIAGNOSIS Diagnosis Bone, biopsy, Posterior Left iliac bone - POSITIVE FOR ADENOCARCINOMA.- SEE COMMENT. Microscopic Comment The tumor present within the specimen is largely necrotic. Immunohistochemical stains are performed; cytokeratin 7 is positive; cytokeratin 20 demonstrates focal equivocal staining, CDX-2, TTF-1 and PSA are all negative. Overall, the staining pattern is relatively nonspecific  although can be seen in primary tumors of the pancreaticobiliary system and lung. Please correlate with clinical and radiologic impression. The findings are called to Dr. Concha Norway on 09/23/2013. Dr. Donato Heinz has seen this case in consultation with agreement of the above diagnosis. (RH:kh 09/23/13) Willeen Niece MD Pathologist, Electronic Signature(Case signed 09/23/2013)  10/03/2013  REASON FOR ADDENDUM, AMENDMENT OR CORRECTION:[id number]: Addendum issued to comment on results of mucicarmine.Marland Kitchen 10/08/13 09:24:20 AM(ecj)FINAL DIAGNOSIS DiagnosisLung, needle/core biopsy(ies), right wall- POORLY DIFFERENTIATED NON-SMALL CELL CARCINOMA.- SEE MICROSCOPIC DESCRIPTION.Microscopic CommentThe core biopsies are extensively involved by poorly differentiated non-small cell carcinoma.Immunohistochemistry is performed and the tumor is strongly positive for cytokeratin 7 and cytokeratin 903.P63 shows patchy, weak to moderate nuclear positivity. The tumor is negative with cytokeratin 5/6, napsin A and thyroid transcription factor-1. The immunohistochemistry profile is nonspecific, although strong cytokeratin 7 positivity is seen more frequently with adenocarcinoma. Mucicarmine will be performed and reported as an addendum. Dr Donato Heinz has reviewed this case and agrees. (JDP:ecj 10/07/2013) Addendum: Mucicarmine is performed and the tumor shows scattered foci of positivity consistent with adenocarcinoma. (JDP:ecj 10/08/2013) Claudette Laws MD Pathologist, Electronic Signature (Case signed 10/07/2013)  ASSESSMENT: [name] 80 y.o. male with a history of Bone metastases - Plan: CBC with Differential, Basic metabolic panel (Bmet) - [facility]  Hypokalemia - Plan: Basic metabolic panel (Bmet) - [facility]  PLAN:  1. 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 Geroge Baseman which is consistent with the above diagnosis and has been sent for molecular testing.   Patient functional status is borderline and now has complications from a sacral stage III ulcer making chemotherapy less an option.  We will discuss the results of his molecular testing and consider lesser aggressive therapies versus best supportive therapy.   2. R hip pain secondary to #1.  -- He is undergoing radiation oncology for palliative XRT to the right hip by Dr. Lisbeth Renshaw and will finish next Thursday.   3. Sacral Decubitus ulcer. --Continue wound care by [facility].   4. Hypokalemia, moderate  --He is currently asymptomatic. We provided 60 mEq KCL today and will continue 20 mEQ  daily with frequent labs.  His hypokalemia likely secondary to poor nutrition.  He denies vomiting and diarrhea presently.   5. Follow-up.  -- Patient will follow-up in 2 weeks s/p XRT and  to discuss further treatment options.    All questions were answered. The patient knows to call the clinic with any problems, questions or concerns. We can certainly see the patient much sooner if necessary.  I spent 15 minutes counseling the patient face to face. The total time spent in the appointment was 25 minutes.    Concha Norway, MD 10/10/2013 5:06 PM   HPI

## 2013-10-10 NOTE — Telephone Encounter (Signed)
Ok/will sign orders

## 2013-10-10 NOTE — Telephone Encounter (Signed)
Spoke to [place] at [facility] asked her what does she suggest? Per Dr. Laurence Spates said she will try Duoderm with Tegraderm film over it. Told her okay whatever she feels will work for the pressure ulcer. Send orders over for Dr.K to sign. [name] verbalized understanding.

## 2013-10-10 NOTE — Telephone Encounter (Signed)
Noted  

## 2013-10-11 ENCOUNTER — Ambulatory Visit (HOSPITAL_BASED_OUTPATIENT_CLINIC_OR_DEPARTMENT_OTHER): Payer: Medicare Other

## 2013-10-11 ENCOUNTER — Ambulatory Visit
Admission: RE | Admit: 2013-10-11 | Discharge: 2013-10-11 | Disposition: A | Discharge status: Home / Self Care | Payer: Medicare Other | Source: Ambulatory Visit | Attending: Radiation Oncology | Admitting: Radiation Oncology

## 2013-10-11 VITALS — BP 121/82 | HR 116 | Temp 97.9°F | Ht 65.0 in

## 2013-10-11 DIAGNOSIS — C3430 Malignant neoplasm of lower lobe, unspecified bronchus or lung: Secondary | ICD-10-CM

## 2013-10-11 DIAGNOSIS — C7951 Secondary malignant neoplasm of bone: Secondary | ICD-10-CM

## 2013-10-11 DIAGNOSIS — E876 Hypokalemia: Secondary | ICD-10-CM

## 2013-10-11 DIAGNOSIS — C7952 Secondary malignant neoplasm of bone marrow: Secondary | ICD-10-CM

## 2013-10-11 LAB — BASIC METABOLIC PANEL (CC13)
Anion Gap: 16 mEq/L — ABNORMAL HIGH (ref 3–11)
BUN: 17.5 mg/dL (ref 7.0–26.0)
CALCIUM: 9.9 mg/dL (ref 8.4–10.4)
CO2: 27 meq/L (ref 22–29)
Chloride: 100 mEq/L (ref 98–109)
Creatinine: 0.9 mg/dL (ref 0.7–1.3)
Glucose: 132 mg/dl (ref 70–140)
Potassium: 3.2 mEq/L — ABNORMAL LOW (ref 3.5–5.1)
SODIUM: 143 meq/L (ref 136–145)

## 2013-10-11 NOTE — Progress Notes (Signed)
   Department of Radiation Oncology  Phone:  [phone] Fax:        [phone]  Weekly Treatment Note    Name: TIMATHY NEWBERRY Date: 10/11/2013 MRN: [id number] DOB: [birth year]   Current dose: 21 Gy  Current fraction: 7   MEDICATIONS: Current Outpatient Prescriptions  Medication Sig Dispense Refill  . HYDROcodone-acetaminophen (NORCO) 10-325 MG per tablet Take 1 tablet by mouth every 6 (six) hours as needed.  60 tablet  0  . potassium chloride SA (K-DUR,KLOR-CON) 20 MEQ tablet Take 1 tablet (20 mEq total) by mouth daily.  30 tablet  1  . emollient (BIAFINE) cream Apply 1 application topically 2 (two) times daily. Apply to affected skin areas after rad tx and bedtime       No current facility-administered medications for this encounter.     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 The chart was checked and the patient's films were reviewed. The patient is doing satisfactorily with treatment. No complaints in terms of nausea or GI issues. No esophagitis. The patient has had a little improvement in his pain but not to a major degree currently. Medical oncology is awaiting further tests prior to making a final decision on systemic treatment.  PHYSICAL EXAMINATION: height is 5\' 5"  (1.651 m). His temperature is 97.9 F (36.6 C). His blood pressure is 121/82 and his pulse is 116. His oxygen saturation is 93%.        ASSESSMENT: The patient is doing satisfactorily with treatment.  PLAN: We will continue with the  patient's radiation treatment as planned.

## 2013-10-11 NOTE — Progress Notes (Addendum)
[name] has had 7 fractions to his right hip, left pelvis and sternum. He reports pain in his left hip with ambulation.  He is currently in a wheelchair and tries to use a walker at home to get to the bathroom.  He reports having diarrhea this morning from his potassium pills.  Advised that he could take Imodium over the counter as needed.  He denies bladder issues.  He denies a sore throat and a cough.  His daughter says that his skin is intact.  His heart rate today is elevated at 116 and it is irregular.  He was unable to stand to be weight today.  He reports a poor appetite and thinks that he has lost weight.

## 2013-10-14 ENCOUNTER — Telehealth: Payer: Self-pay | Admitting: Dietician

## 2013-10-14 ENCOUNTER — Ambulatory Visit
Admission: RE | Admit: 2013-10-14 | Discharge: 2013-10-14 | Disposition: A | Discharge status: Home / Self Care | Payer: Medicare Other | Source: Ambulatory Visit | Attending: Radiation Oncology | Admitting: Radiation Oncology

## 2013-10-14 ENCOUNTER — Encounter (HOSPITAL_COMMUNITY): Payer: Self-pay

## 2013-10-14 NOTE — Telephone Encounter (Signed)
Brief Outpatient Oncology Nutrition Note  Patient has been identified to be at risk on malnutrition screen.  Wt Readings from Last 10 Encounters:  10/03/13 164 lb (74.39 kg)  09/19/13 165 lb (74.844 kg)  08/20/13 173 lb (78.472 kg)  08/07/13 173 lb (78.472 kg)  07/12/13 171 lb (77.565 kg)  07/08/13 168 lb (76.204 kg)  06/10/13 169 lb 9.6 oz (76.93 kg)  05/28/13 171 lb (77.565 kg)  04/19/13 177 lb (80.287 kg)  01/11/13 170 lb (77.111 kg)    Dx:  Stage IV NSCLC with mets to bone undergoing palliative SRT.  Patient of Dr. Juliann Mule and Lisbeth Renshaw,  Stage III sacral decub.    Called and spoke with patient's daughter who reports patient with a decreased appetite and intake now increasing out of XRT.  Patient will drink smoothies made with Boost or Ensure but otherwise intake is poor.  Daughter encouraging intake. Discussed increased protein needs and sources of protein.  Will mail patient tips to increase calories and protein along with coupons for Boost and Ensure and contact information for the [place] [facility].  Antonieta Iba, RD, LDN

## 2013-10-15 ENCOUNTER — Ambulatory Visit: Payer: Medicare Other

## 2013-10-15 ENCOUNTER — Ambulatory Visit
Admission: RE | Admit: 2013-10-15 | Discharge: 2013-10-15 | Disposition: A | Discharge status: Home / Self Care | Payer: Medicare Other | Source: Ambulatory Visit | Attending: Radiation Oncology | Admitting: Radiation Oncology

## 2013-10-16 ENCOUNTER — Ambulatory Visit
Admission: RE | Admit: 2013-10-16 | Discharge: 2013-10-16 | Disposition: A | Discharge status: Home / Self Care | Payer: Medicare Other | Source: Ambulatory Visit | Attending: Radiation Oncology | Admitting: Radiation Oncology

## 2013-10-16 ENCOUNTER — Ambulatory Visit: Payer: Medicare Other

## 2013-10-16 ENCOUNTER — Encounter: Payer: Self-pay | Admitting: Radiation Oncology

## 2013-10-16 HISTORY — DX: Secondary malignant neoplasm of bone: C79.51

## 2013-10-16 HISTORY — DX: Malignant neoplasm of unspecified part of unspecified bronchus or lung: C34.90

## 2013-10-16 NOTE — Progress Notes (Signed)
Weekly rad txs 10/10 Rt hip, Lt pelvis, Sternum, patient in w/c deferred weight, pain from lying on table from rad tx, mild stated, no nausea, poor appetite,eats better on weekends, had 1/2 cup grrek yopgurt, 2-3 sips chickensoup and 1/2 can boost today,, gave 1 month f/u appt card ,patient fatigued,weak, making jokes though, wrapped in blankets, cold 2:24 PM

## 2013-10-17 ENCOUNTER — Telehealth: Payer: Self-pay | Admitting: Internal Medicine

## 2013-10-17 NOTE — Telephone Encounter (Signed)
Spoke to [place] told her okay to use Debrox OTC for ear wax build up. Clarene Critchley verbalized understanding.

## 2013-10-17 NOTE — Telephone Encounter (Signed)
Pt daughter would like know if pt can use an over the counter Debrox med for his ear wax build up  She said they have used it before

## 2013-10-18 ENCOUNTER — Emergency Department (HOSPITAL_COMMUNITY): Payer: Medicare Other

## 2013-10-18 ENCOUNTER — Other Ambulatory Visit: Payer: Self-pay

## 2013-10-18 ENCOUNTER — Encounter (HOSPITAL_COMMUNITY): Payer: Self-pay | Admitting: Emergency Medicine

## 2013-10-18 ENCOUNTER — Inpatient Hospital Stay (HOSPITAL_COMMUNITY)
Admission: EM | Admit: 2013-10-18 | Discharge: 2013-10-23 | DRG: 180 | Disposition: A | Discharge status: Hospice | Payer: Medicare Other | Attending: Internal Medicine | Admitting: Internal Medicine

## 2013-10-18 ENCOUNTER — Telehealth: Payer: Self-pay

## 2013-10-18 DIAGNOSIS — L8993 Pressure ulcer of unspecified site, stage 3: Secondary | ICD-10-CM | POA: Diagnosis present

## 2013-10-18 DIAGNOSIS — Z85118 Personal history of other malignant neoplasm of bronchus and lung: Secondary | ICD-10-CM

## 2013-10-18 DIAGNOSIS — D649 Anemia, unspecified: Secondary | ICD-10-CM

## 2013-10-18 DIAGNOSIS — R5381 Other malaise: Secondary | ICD-10-CM | POA: Diagnosis present

## 2013-10-18 DIAGNOSIS — I10 Essential (primary) hypertension: Secondary | ICD-10-CM | POA: Diagnosis present

## 2013-10-18 DIAGNOSIS — IMO0002 Reserved for concepts with insufficient information to code with codable children: Secondary | ICD-10-CM

## 2013-10-18 DIAGNOSIS — Z87891 Personal history of nicotine dependence: Secondary | ICD-10-CM

## 2013-10-18 DIAGNOSIS — L89309 Pressure ulcer of unspecified buttock, unspecified stage: Secondary | ICD-10-CM | POA: Diagnosis present

## 2013-10-18 DIAGNOSIS — Z923 Personal history of irradiation: Secondary | ICD-10-CM

## 2013-10-18 DIAGNOSIS — E43 Unspecified severe protein-calorie malnutrition: Secondary | ICD-10-CM | POA: Insufficient documentation

## 2013-10-18 DIAGNOSIS — Z801 Family history of malignant neoplasm of trachea, bronchus and lung: Secondary | ICD-10-CM

## 2013-10-18 DIAGNOSIS — E785 Hyperlipidemia, unspecified: Secondary | ICD-10-CM | POA: Diagnosis present

## 2013-10-18 DIAGNOSIS — R627 Adult failure to thrive: Secondary | ICD-10-CM | POA: Diagnosis present

## 2013-10-18 DIAGNOSIS — Z515 Encounter for palliative care: Secondary | ICD-10-CM

## 2013-10-18 DIAGNOSIS — C7952 Secondary malignant neoplasm of bone marrow: Secondary | ICD-10-CM

## 2013-10-18 DIAGNOSIS — R000 Tachycardia, unspecified: Secondary | ICD-10-CM | POA: Diagnosis present

## 2013-10-18 DIAGNOSIS — C7951 Secondary malignant neoplasm of bone: Secondary | ICD-10-CM | POA: Diagnosis present

## 2013-10-18 DIAGNOSIS — D6959 Other secondary thrombocytopenia: Secondary | ICD-10-CM | POA: Diagnosis present

## 2013-10-18 DIAGNOSIS — D638 Anemia in other chronic diseases classified elsewhere: Secondary | ICD-10-CM | POA: Diagnosis present

## 2013-10-18 DIAGNOSIS — L8990 Pressure ulcer of unspecified site, unspecified stage: Secondary | ICD-10-CM | POA: Diagnosis present

## 2013-10-18 DIAGNOSIS — R630 Anorexia: Secondary | ICD-10-CM | POA: Diagnosis present

## 2013-10-18 DIAGNOSIS — Z66 Do not resuscitate: Secondary | ICD-10-CM | POA: Diagnosis present

## 2013-10-18 DIAGNOSIS — Z8249 Family history of ischemic heart disease and other diseases of the circulatory system: Secondary | ICD-10-CM

## 2013-10-18 DIAGNOSIS — C3490 Malignant neoplasm of unspecified part of unspecified bronchus or lung: Principal | ICD-10-CM | POA: Diagnosis present

## 2013-10-18 DIAGNOSIS — S72142A Displaced intertrochanteric fracture of left femur, initial encounter for closed fracture: Secondary | ICD-10-CM

## 2013-10-18 DIAGNOSIS — G9340 Encephalopathy, unspecified: Secondary | ICD-10-CM | POA: Diagnosis present

## 2013-10-18 DIAGNOSIS — R404 Transient alteration of awareness: Secondary | ICD-10-CM | POA: Diagnosis present

## 2013-10-18 DIAGNOSIS — J8410 Pulmonary fibrosis, unspecified: Secondary | ICD-10-CM | POA: Diagnosis present

## 2013-10-18 DIAGNOSIS — D630 Anemia in neoplastic disease: Secondary | ICD-10-CM | POA: Diagnosis present

## 2013-10-18 DIAGNOSIS — Z833 Family history of diabetes mellitus: Secondary | ICD-10-CM

## 2013-10-18 DIAGNOSIS — R5383 Other fatigue: Secondary | ICD-10-CM

## 2013-10-18 DIAGNOSIS — E876 Hypokalemia: Secondary | ICD-10-CM | POA: Diagnosis present

## 2013-10-18 DIAGNOSIS — Z7401 Bed confinement status: Secondary | ICD-10-CM

## 2013-10-18 DIAGNOSIS — E860 Dehydration: Secondary | ICD-10-CM | POA: Diagnosis present

## 2013-10-18 DIAGNOSIS — I2510 Atherosclerotic heart disease of native coronary artery without angina pectoris: Secondary | ICD-10-CM | POA: Diagnosis present

## 2013-10-18 DIAGNOSIS — R531 Weakness: Secondary | ICD-10-CM

## 2013-10-18 LAB — CBC WITH DIFFERENTIAL/PLATELET
BASOS ABS: 0 10*3/uL (ref 0.0–0.1)
BASOS PCT: 0 % (ref 0–1)
EOS PCT: 0 % (ref 0–5)
Eosinophils Absolute: 0 10*3/uL (ref 0.0–0.7)
HEMATOCRIT: 32.9 % — AB (ref 39.0–52.0)
Hemoglobin: 10.6 g/dL — ABNORMAL LOW (ref 13.0–17.0)
Lymphocytes Relative: 8 % — ABNORMAL LOW (ref 12–46)
Lymphs Abs: 0.6 10*3/uL — ABNORMAL LOW (ref 0.7–4.0)
MCH: 26.8 pg (ref 26.0–34.0)
MCHC: 32.2 g/dL (ref 30.0–36.0)
MCV: 83.3 fL (ref 78.0–100.0)
MONO ABS: 0.8 10*3/uL (ref 0.1–1.0)
Monocytes Relative: 10 % (ref 3–12)
NEUTROS ABS: 6.6 10*3/uL (ref 1.7–7.7)
Neutrophils Relative %: 81 % — ABNORMAL HIGH (ref 43–77)
Platelets: 107 10*3/uL — ABNORMAL LOW (ref 150–400)
RBC: 3.95 MIL/uL — ABNORMAL LOW (ref 4.22–5.81)
RDW: 16.7 % — AB (ref 11.5–15.5)
WBC: 8.1 10*3/uL (ref 4.0–10.5)

## 2013-10-18 LAB — COMPREHENSIVE METABOLIC PANEL
ALBUMIN: 2.3 g/dL — AB (ref 3.5–5.2)
ALT: 21 U/L (ref 0–53)
AST: 24 U/L (ref 0–37)
Alkaline Phosphatase: 71 U/L (ref 39–117)
BUN: 15 mg/dL (ref 6–23)
CALCIUM: 9.8 mg/dL (ref 8.4–10.5)
CHLORIDE: 102 meq/L (ref 96–112)
CO2: 28 mEq/L (ref 19–32)
CREATININE: 0.75 mg/dL (ref 0.50–1.35)
GFR calc Af Amer: >90 mL/min (ref >90)
GFR calc non Af Amer: 83 mL/min — ABNORMAL LOW (ref >90)
Glucose, Bld: 112 mg/dL — ABNORMAL HIGH (ref 70–99)
Potassium: 3.4 mEq/L — ABNORMAL LOW (ref 3.7–5.3)
Sodium: 143 mEq/L (ref 137–147)
Total Bilirubin: 0.9 mg/dL (ref 0.3–1.2)
Total Protein: 5.7 g/dL — ABNORMAL LOW (ref 6.0–8.3)

## 2013-10-18 LAB — URINALYSIS, ROUTINE W REFLEX MICROSCOPIC
Glucose, UA: NEGATIVE mg/dL
Hgb urine dipstick: NEGATIVE
Ketones, ur: NEGATIVE mg/dL
NITRITE: NEGATIVE
Protein, ur: NEGATIVE mg/dL
SPECIFIC GRAVITY, URINE: 1.016 (ref 1.005–1.030)
Urobilinogen, UA: 1 mg/dL (ref 0.0–1.0)
pH: 6 (ref 5.0–8.0)

## 2013-10-18 LAB — PROTIME-INR
INR: 1.51 — AB (ref 0.00–1.49)
Prothrombin Time: 17.8 seconds — ABNORMAL HIGH (ref 11.6–15.2)

## 2013-10-18 LAB — I-STAT TROPONIN, ED: Troponin i, poc: 0 ng/mL (ref 0.00–0.08)

## 2013-10-18 LAB — APTT: APTT: 37 s (ref 24–37)

## 2013-10-18 LAB — URINE MICROSCOPIC-ADD ON

## 2013-10-18 MED ORDER — SODIUM CHLORIDE 0.9 % IV SOLN
1000.0000 mL | Freq: Once | INTRAVENOUS | Status: AC
  Administered 2013-10-18: 1000 mL via INTRAVENOUS

## 2013-10-18 MED ORDER — IOHEXOL 350 MG/ML SOLN
100.0000 mL | Freq: Once | INTRAVENOUS | Status: AC | PRN
  Administered 2013-10-18: 100 mL via INTRAVENOUS

## 2013-10-18 MED ORDER — DILTIAZEM HCL 25 MG/5ML IV SOLN
10.0000 mg | Freq: Once | INTRAVENOUS | Status: AC
  Administered 2013-10-18: 23:00:00 via INTRAVENOUS
  Filled 2013-10-18: qty 5

## 2013-10-18 MED ORDER — HYDROCODONE-ACETAMINOPHEN 10-325 MG PO TABS: 1.0000 | ORAL_TABLET | Freq: Four times a day (QID) | ORAL | Status: AC | PRN

## 2013-10-18 MED ORDER — SODIUM CHLORIDE 0.9 % IV SOLN
1000.0000 mL | INTRAVENOUS | Status: DC
  Administered 2013-10-18 – 2013-10-20 (×5): 1000 mL via INTRAVENOUS

## 2013-10-18 NOTE — ED Notes (Signed)
Patient transported to CT 

## 2013-10-18 NOTE — Telephone Encounter (Signed)
Returned [place] call from 412 pm. She said he has only had 2 bites of cereal this AM, doesn't want to eat, is still in the bed, doesn't want to get up. What should she do? S/w Dr Juliann Mule and he said to take pt to ER. She will do that.

## 2013-10-18 NOTE — ED Notes (Signed)
Bed: WA21 Expected date:  Expected time:  Means of arrival:  Comments: EMS-[facility]-cancer pt

## 2013-10-18 NOTE — Telephone Encounter (Signed)
S/w Pamala Hurry RN from [facility]. This is her report: Pt has duoderm dressing to sacral wound that needs changing weekly unless soiled. They have shown the family how to change the duoderm. Physical therapist reported increased strength. Pt is not eating because he is afraid to have BM. [facility] has told pt he needs to sleep in the bed to decrease pressure on sacrum. Pt continues to sleep in the recliner. The pt is not being cleaned well after BMs. She has noted dirty skin with old stool.. He has an aid that comes in 2-3 hours per day.   S/w Mechele Claude the daughter 3 times today. She is asking for extension of skilled nursing beyond 11/01/13. This happens closer to end date of [facility] care. She is asking for a nurse to come in daily or every other day to change the sacral dressing. She is asking for home aid longer than 2-3 hours per day. She is stating her father is deteriorating quickly. That the daughters do not live in the house. That she is not strong enough to turn her father over to clean him well or change the dressing. She was telling me that medicare said one thing about coverage and [facility] said another thing about what will be paid for.  S/w Dr Juliann Mule. He said he will call the family on Monday. That pt is a possible candidate for hospice and this may be the needed direction.

## 2013-10-18 NOTE — ED Provider Notes (Addendum)
CSN: [id number]     Arrival date & time 10/18/13  1849 History   First MD Initiated Contact with Patient 10/18/13 1905     Chief Complaint  Patient presents with  . Weakness    HPI Comments: Pt has history of lung cancer with mets to the bone.  He has been receiving radiation treatment to the sternum and pelvis area for paliative care.  Pt is scheduled to see Dr Lorelle Formosa on Friday to see if there are any other treatment options.  He has been getting progressively weaker and getting confused.    Patient is a 80 y.o. male presenting with weakness. The history is provided by the patient.  Weakness This is a new problem. The current episode started more than 1 week ago. The problem occurs constantly. The problem has been gradually worsening. Associated symptoms include shortness of breath (sometimes but not recenty). Pertinent negatives include no chest pain and no headaches.   patient states he is too weak to stand. He continues to have pain in his pelvis and hip associated with his bone metastases.  Past Medical History  Diagnosis Date  . CORONARY ARTERY DISEASE 12/26/2006  . HYPERLIPIDEMIA 12/26/2006  . HYPERTENSION 12/26/2006  . PERIPHERAL VASCULAR DISEASE 12/26/2006  . Allergy   . Anxiety   . Arthritis     hands  . Bone metastases   . Lung cancer     lung mets adenocarcinoma/bone mets   Past Surgical History  Procedure Laterality Date  . Abdominal aortic aneurysm repair    . Transurethral resection of prostate    . Hernia repair      ingunial  . Cardiac catheterization    . Intramedullary (im) nail intertrochanteric Left 12/03/2012    Procedure: INTRAMEDULLARY (IM) NAIL INTERTROCHANTRIC;  Surgeon: Wylene Simmer, MD;  Location: [facility];  Service: Orthopedics;  Laterality: Left;   Family History  Problem Relation Age of Onset  . Diabetes Mother   . Hypertension Mother    History  Substance Use Topics  . Smoking status: Former Smoker -- 0.50 packs/day    Types: Cigarettes    Quit  date: 06/20/1978  . Smokeless tobacco: Never Used  . Alcohol Use: 0.6 oz/week    1 Glasses of wine per week     Comment: rarely    Review of Systems  Constitutional: Negative for fever.  Respiratory: Positive for shortness of breath (sometimes but not recenty).   Cardiovascular: Negative for chest pain.  Gastrointestinal: Positive for diarrhea. Negative for vomiting.  Neurological: Positive for weakness. Negative for seizures and headaches.       Pt is unable to walk on his own   All other systems reviewed and are negative.     Allergies  Cardura  Home Medications   Prior to Admission medications   Medication Sig Start Date End Date Taking? Authorizing Provider  emollient (BIAFINE) cream Apply 1 application topically 2 (two) times daily. Apply to affected skin areas after rad tx and bedtime 10/07/13   Marye Round, MD  HYDROcodone-acetaminophen [facility]) 10-325 MG per tablet Take 1 tablet by mouth every 6 (six) hours as needed. 10/18/13   Concha Norway, MD  potassium chloride SA (K-DUR,KLOR-CON) 20 MEQ tablet Take 1 tablet (20 mEq total) by mouth daily. 10/10/13   Concha Norway, MD   BP 124/70  Pulse 133  Temp(Src) 99.4 F (37.4 C) (Oral)  Resp 23  SpO2 97% Physical Exam  Nursing note and vitals reviewed. Constitutional: No distress.  Frail,  elderly  HENT:  Head: Normocephalic and atraumatic.  Right Ear: External ear normal.  Left Ear: External ear normal.  Mucous membranes are dry  Eyes: Conjunctivae are normal. Right eye exhibits no discharge. Left eye exhibits no discharge. No scleral icterus.  Neck: Neck supple. No tracheal deviation present.  Cardiovascular: Regular rhythm and intact distal pulses.  Tachycardia present.   Pulmonary/Chest: Effort normal and breath sounds normal. No stridor. No respiratory distress. He has no wheezes. He has no rales.  Abdominal: Soft. Bowel sounds are normal. He exhibits no distension. There is no tenderness. There is no rebound and no  guarding.  Musculoskeletal: He exhibits no edema and no tenderness.       Right hip: He exhibits decreased strength and bony tenderness.       Left hip: He exhibits decreased strength and bony tenderness.  Sacral decbitus ulcer without surrounding erythema or drainage  Neurological: He is alert. No cranial nerve deficit (no facial droop, extraocular movements intact, no slurred speech) or sensory deficit. He exhibits normal muscle tone. He displays no seizure activity. Coordination normal.  Generalized weakness  Skin: Skin is warm and dry. No rash noted.  Psychiatric: He has a normal mood and affect.    ED Course  Procedures (including critical care time)  CRITICAL CARE Performed by: Kathalene Frames Total critical care time: 35 Critical care time was exclusive of separately billable procedures and treating other patients. Critical care was necessary to treat or prevent imminent or life-threatening deterioration. Critical care was time spent personally by me on the following activities: development of treatment plan with patient and/or surrogate as well as nursing, discussions with consultants, evaluation of patient's response to treatment, examination of patient, obtaining history from patient or surrogate, ordering and performing treatments and interventions, ordering and review of laboratory studies, ordering and review of radiographic studies, pulse oximetry and re-evaluation of patient's condition.  Labs Review Labs Reviewed  CBC WITH DIFFERENTIAL - Abnormal; Notable for the following:    RBC 3.95 (*)    Hemoglobin 10.6 (*)    HCT 32.9 (*)    RDW 16.7 (*)    Platelets 107 (*)    Neutrophils Relative % 81 (*)    Lymphocytes Relative 8 (*)    Lymphs Abs 0.6 (*)    All other components within normal limits  COMPREHENSIVE METABOLIC PANEL - Abnormal; Notable for the following:    Potassium 3.4 (*)    Glucose, Bld 112 (*)    Total Protein 5.7 (*)    Albumin 2.3 (*)    GFR calc non Af  Amer 83 (*)    All other components within normal limits  PROTIME-INR - Abnormal; Notable for the following:    Prothrombin Time 17.8 (*)    INR 1.51 (*)    All other components within normal limits  URINALYSIS, ROUTINE W REFLEX MICROSCOPIC - Abnormal; Notable for the following:    Color, Urine AMBER (*)    APPearance CLOUDY (*)    Bilirubin Urine SMALL (*)    Leukocytes, UA TRACE (*)    All other components within normal limits  URINE MICROSCOPIC-ADD ON - Abnormal; Notable for the following:    Crystals URIC ACID CRYSTALS (*)    All other components within normal limits  URINE CULTURE  APTT  I-STAT TROPOININ, ED    Imaging Review Dg Chest 1 View  10/18/2013   CLINICAL DATA:  Weakness.  EXAM: CHEST - 1 VIEW  COMPARISON:  10/03/2013.  FINDINGS: The cardiac silhouette remains borderline enlarged. Mildly prominent interstitial markings with improvement. Minimal bilateral pleural thickening or fluid. Thoracic spine degenerative changes.  IMPRESSION: 1. Mild chronic interstitial lung disease with probable resolution of superimposed mild interstitial pulmonary edema.  2.  Minimal bilateral pleural thickening or fluid.   Electronically Signed   By: Enrique Sack M.D.   On: 10/18/2013 20:49   Ct Head Wo Contrast  10/18/2013   CLINICAL DATA:  Altered mental status, weakness, metastatic lung cancer  EXAM: CT HEAD WITHOUT CONTRAST  TECHNIQUE: Contiguous axial images were obtained from the base of the skull through the vertex without intravenous contrast.  COMPARISON:  None.  FINDINGS: No evidence of parenchymal hemorrhage or extra-axial fluid collection. No mass lesion, mass effect, or midline shift.  No CT evidence of acute infarction.  Subcortical white matter and periventricular small vessel ischemic changes. Intracranial atherosclerosis.  Global cortical atrophy with prominent frontal extra-axial space. No ventriculomegaly.  The visualized paranasal sinuses are essentially clear. The mastoid air cells  are unopacified.  No evidence of calvarial fracture.  IMPRESSION: No evidence of acute intracranial abnormality.  Atrophy with small vessel ischemic changes.   Electronically Signed   By: Julian Hy M.D.   On: 10/18/2013 20:39     EKG Interpretation   Date/Time:  Friday Oct 18 2013 18:56:56 EDT Ventricular Rate:  125 PR Interval:  156 QRS Duration: 88 QT Interval:  340 QTC Calculation: 490 R Axis:   35 Text Interpretation:  Sinus tachycardia Low voltage, extremity and  precordial leads Minimal ST depression, anterolateral leads , new since  last tracing Borderline prolonged QT interval Since last tracing rate  faster Confirmed by Erick Murin  MD-J, Kaliann Coryell ([id number]) on 10/18/2013 7:52:37 PM      Medications  0.9 %  sodium chloride infusion (0 mLs Intravenous Stopped 10/18/13 2128)    Followed by  0.9 %  sodium chloride infusion (1,000 mLs Intravenous New Bag/Given 10/18/13 2236)  diltiazem (CARDIZEM) injection 10 mg (0 mg Intravenous Stopped 10/18/13 2241)    1045pm   Discussed findings with patient.   Persistently tachycardic despite fluids.  Will CT chest to rule out PE.  Suspect sinus tach but could be flutter.  Will give dose of cardizem, recheck EKG.   MDM   Final diagnoses:  Weakness  Tachycardia  Anemia   Pt with worsening weakness and confusion.   Anemia is worsening.  Will guiac stool  Persistent tachycardia.  Suspect a component of dehydration with his poor po intake but his bun and cr are unremarkable.  Will ct to rule out PE.  Repeat EKG shows persistent sinus tach  Generalized weakness likely multifactorial.  No PE noted on CT.  Will consult with hospitalist regarding admission for further treatment.    Kathalene Frames, MD 10/18/13 [place], MD 10/18/13 [phone]

## 2013-10-18 NOTE — ED Notes (Signed)
Family at bedside. 

## 2013-10-18 NOTE — ED Notes (Signed)
Pt's family reports increasing AMS and weakness x 2 weeks, pt has METS.

## 2013-10-18 NOTE — ED Notes (Signed)
Pt is resting comfortably with family at bedside.

## 2013-10-18 NOTE — Telephone Encounter (Signed)
Last radiation on Wed, weaker, more confused (forgetting time and date). Unable today to stand with walker. [name] also asked about getting pressure dressing changed more frequently, getting adequate supplies for dressing change. She said medicare gave her certain parameters for coverage of services, and [facility] said differently. I s/w Lauren and she has talked with family multiple times about this issue. The family will need to talk with [facility] direct If they want to find another home health agency that is feasible too. I lvm with [facility] about frequency of dressing changes.

## 2013-10-18 NOTE — ED Notes (Signed)
Pt's daughters at bedside reports increasing weakness and AMS x 2 weeks.  Was dx with METS x 2 months ago and started radiation last week Wednesday.  They reports that pt has not eaten or drank enough x 2 days.  Pt is A&Ox 4 at this time.  Pt however is drowsy but easily arousable.

## 2013-10-19 ENCOUNTER — Encounter (HOSPITAL_COMMUNITY): Payer: Self-pay | Admitting: Internal Medicine

## 2013-10-19 DIAGNOSIS — C7951 Secondary malignant neoplasm of bone: Secondary | ICD-10-CM

## 2013-10-19 DIAGNOSIS — I379 Nonrheumatic pulmonary valve disorder, unspecified: Secondary | ICD-10-CM

## 2013-10-19 DIAGNOSIS — E876 Hypokalemia: Secondary | ICD-10-CM

## 2013-10-19 DIAGNOSIS — D649 Anemia, unspecified: Secondary | ICD-10-CM

## 2013-10-19 DIAGNOSIS — D696 Thrombocytopenia, unspecified: Secondary | ICD-10-CM

## 2013-10-19 DIAGNOSIS — L89109 Pressure ulcer of unspecified part of back, unspecified stage: Secondary | ICD-10-CM

## 2013-10-19 DIAGNOSIS — M25559 Pain in unspecified hip: Secondary | ICD-10-CM

## 2013-10-19 DIAGNOSIS — E860 Dehydration: Secondary | ICD-10-CM

## 2013-10-19 DIAGNOSIS — R000 Tachycardia, unspecified: Secondary | ICD-10-CM | POA: Diagnosis present

## 2013-10-19 DIAGNOSIS — C3430 Malignant neoplasm of lower lobe, unspecified bronchus or lung: Secondary | ICD-10-CM

## 2013-10-19 DIAGNOSIS — R630 Anorexia: Secondary | ICD-10-CM

## 2013-10-19 DIAGNOSIS — C7952 Secondary malignant neoplasm of bone marrow: Secondary | ICD-10-CM

## 2013-10-19 DIAGNOSIS — F29 Unspecified psychosis not due to a substance or known physiological condition: Secondary | ICD-10-CM

## 2013-10-19 DIAGNOSIS — L8990 Pressure ulcer of unspecified site, unspecified stage: Secondary | ICD-10-CM

## 2013-10-19 LAB — COMPREHENSIVE METABOLIC PANEL
ALK PHOS: 63 U/L (ref 39–117)
ALT: 19 U/L (ref 0–53)
AST: 20 U/L (ref 0–37)
Albumin: 2.1 g/dL — ABNORMAL LOW (ref 3.5–5.2)
BILIRUBIN TOTAL: 0.9 mg/dL (ref 0.3–1.2)
BUN: 13 mg/dL (ref 6–23)
CHLORIDE: 104 meq/L (ref 96–112)
CO2: 28 mEq/L (ref 19–32)
Calcium: 9.3 mg/dL (ref 8.4–10.5)
Creatinine, Ser: 0.78 mg/dL (ref 0.50–1.35)
GFR, EST NON AFRICAN AMERICAN: 81 mL/min — AB (ref >90)
GLUCOSE: 120 mg/dL — AB (ref 70–99)
Potassium: 3.2 mEq/L — ABNORMAL LOW (ref 3.7–5.3)
Sodium: 143 mEq/L (ref 137–147)
Total Protein: 5.2 g/dL — ABNORMAL LOW (ref 6.0–8.3)

## 2013-10-19 LAB — OCCULT BLOOD X 1 CARD TO LAB, STOOL: Fecal Occult Bld: NEGATIVE

## 2013-10-19 LAB — IRON AND TIBC
Iron: 27 ug/dL — ABNORMAL LOW (ref 42–135)
Saturation Ratios: 23 % (ref 20–55)
TIBC: 115 ug/dL — ABNORMAL LOW (ref 215–435)
UIBC: 88 ug/dL — AB (ref 125–400)

## 2013-10-19 LAB — TROPONIN I
Troponin I: <0.3 ng/mL (ref <0.30)
Troponin I: <0.3 ng/mL (ref <0.30)

## 2013-10-19 LAB — CBC
HCT: 30.1 % — ABNORMAL LOW (ref 39.0–52.0)
Hemoglobin: 9.9 g/dL — ABNORMAL LOW (ref 13.0–17.0)
MCH: 27.4 pg (ref 26.0–34.0)
MCHC: 32.9 g/dL (ref 30.0–36.0)
MCV: 83.4 fL (ref 78.0–100.0)
PLATELETS: 100 10*3/uL — AB (ref 150–400)
RBC: 3.61 MIL/uL — ABNORMAL LOW (ref 4.22–5.81)
RDW: 16.7 % — AB (ref 11.5–15.5)
WBC: 7.4 10*3/uL (ref 4.0–10.5)

## 2013-10-19 LAB — HEMOGLOBIN A1C
Hgb A1c MFr Bld: 5.8 % — ABNORMAL HIGH (ref <5.7)
Mean Plasma Glucose: 120 mg/dL — ABNORMAL HIGH (ref <117)

## 2013-10-19 LAB — RETICULOCYTES
RBC.: 3.61 MIL/uL — AB (ref 4.22–5.81)
Retic Count, Absolute: 108.3 10*3/uL (ref 19.0–186.0)
Retic Ct Pct: 3 % (ref 0.4–3.1)

## 2013-10-19 LAB — MAGNESIUM: Magnesium: 1.6 mg/dL (ref 1.5–2.5)

## 2013-10-19 LAB — VITAMIN B12: Vitamin B-12: 1790 pg/mL — ABNORMAL HIGH (ref 211–911)

## 2013-10-19 LAB — PHOSPHORUS: Phosphorus: 3 mg/dL (ref 2.3–4.6)

## 2013-10-19 LAB — TSH: TSH: 0.894 u[IU]/mL (ref 0.350–4.500)

## 2013-10-19 LAB — FOLATE: FOLATE: 11.6 ng/mL

## 2013-10-19 LAB — PREALBUMIN: Prealbumin: 6.5 mg/dL — ABNORMAL LOW (ref 17.0–34.0)

## 2013-10-19 LAB — FERRITIN: Ferritin: 2653 ng/mL — ABNORMAL HIGH (ref 22–322)

## 2013-10-19 MED ORDER — ONDANSETRON HCL 4 MG/2ML IJ SOLN: 4.0000 mg | Freq: Four times a day (QID) | INTRAMUSCULAR | Status: DC | PRN

## 2013-10-19 MED ORDER — ASPIRIN EC 81 MG PO TBEC
81.0000 mg | DELAYED_RELEASE_TABLET | Freq: Every day | ORAL | Status: DC
  Administered 2013-10-19 – 2013-10-23 (×5): 81 mg via ORAL
  Filled 2013-10-19 (×5): qty 1

## 2013-10-19 MED ORDER — MORPHINE SULFATE 2 MG/ML IJ SOLN
1.0000 mg | INTRAMUSCULAR | Status: DC | PRN
  Administered 2013-10-19: 1 mg via INTRAVENOUS
  Filled 2013-10-19: qty 1

## 2013-10-19 MED ORDER — POTASSIUM CHLORIDE 10 MEQ/100ML IV SOLN
10.0000 meq | INTRAVENOUS | Status: AC
  Administered 2013-10-19 (×2): 10 meq via INTRAVENOUS
  Filled 2013-10-19 (×2): qty 100

## 2013-10-19 MED ORDER — ONDANSETRON HCL 4 MG PO TABS: 4.0000 mg | ORAL_TABLET | Freq: Four times a day (QID) | ORAL | Status: DC | PRN

## 2013-10-19 MED ORDER — POTASSIUM CHLORIDE CRYS ER 20 MEQ PO TBCR
20.0000 meq | EXTENDED_RELEASE_TABLET | Freq: Every day | ORAL | Status: DC
  Administered 2013-10-19 – 2013-10-23 (×5): 20 meq via ORAL
  Filled 2013-10-19 (×5): qty 1

## 2013-10-19 MED ORDER — ACETAMINOPHEN 325 MG PO TABS: 650.0000 mg | ORAL_TABLET | Freq: Four times a day (QID) | ORAL | Status: DC | PRN

## 2013-10-19 MED ORDER — HYDROCODONE-ACETAMINOPHEN 5-325 MG PO TABS
1.0000 | ORAL_TABLET | ORAL | Status: DC | PRN
  Administered 2013-10-19 – 2013-10-21 (×2): 2 via ORAL
  Filled 2013-10-19 (×2): qty 2

## 2013-10-19 MED ORDER — PRUTECT EX EMUL
1.0000 "application " | Freq: Two times a day (BID) | CUTANEOUS | Status: DC
  Administered 2013-10-19 – 2013-10-22 (×8): 1 via TOPICAL
  Filled 2013-10-19: qty 45

## 2013-10-19 MED ORDER — SODIUM CHLORIDE 0.9 % IJ SOLN: 3.0000 mL | Freq: Two times a day (BID) | INTRAMUSCULAR | Status: DC

## 2013-10-19 MED ORDER — ACETAMINOPHEN 650 MG RE SUPP: 650.0000 mg | Freq: Four times a day (QID) | RECTAL | Status: DC | PRN

## 2013-10-19 MED ORDER — ENOXAPARIN SODIUM 40 MG/0.4ML ~~LOC~~ SOLN
40.0000 mg | SUBCUTANEOUS | Status: DC
  Administered 2013-10-19 – 2013-10-23 (×5): 40 mg via SUBCUTANEOUS
  Filled 2013-10-19 (×5): qty 0.4

## 2013-10-19 MED ORDER — DOCUSATE SODIUM 100 MG PO CAPS
100.0000 mg | ORAL_CAPSULE | Freq: Two times a day (BID) | ORAL | Status: DC
  Administered 2013-10-19 – 2013-10-23 (×6): 100 mg via ORAL
  Filled 2013-10-19 (×10): qty 1

## 2013-10-19 NOTE — Progress Notes (Signed)
[name]   DOB:[birth year]   WE#:[id number]   YPP#:[id number]  Subjective:  Patient was confused overnight per nursing.  Otherwise, no acute issues.  He does not have his hearing aides.  His daughter and son-in-law is at bedside.    Objective:  Filed Vitals:   10/19/13 0500  BP: 132/71  Pulse: 115  Temp: 98.8 F (37.1 C)  Resp: 18    Body mass index is 23.82 kg/(m^2).  Intake/Output Summary (Last 24 hours) at 10/19/13 1126 Last data filed at 10/19/13 0700  Gross per 24 hour  Intake   1200 ml  Output    400 ml  Net    800 ml    HOH, chronically ill appearinig  Alert to self, place andpresident  Sclerae unicteric  Oropharynx clear  Lungs clear decreased bs at bases  Heart regular rate and rhythm  Abdomen benign  MSK no peripheral edema  Neuro  Some intermittent confusion, comfortable  Foley bag with clear yellow urine.   Labs:  Lab Results  Component Value Date   WBC 7.4 10/19/2013   HGB 9.9* 10/19/2013   HCT 30.1* 10/19/2013   MCV 83.4 10/19/2013   PLT 100* 10/19/2013   NEUTROABS 6.6 09/22/8097    Basic Metabolic Panel:  Recent Labs Lab 10/18/13 1950 10/19/13 0300  NA 143 143  K 3.4* 3.2*  CL 102 104  CO2 28 28  GLUCOSE 112* 120*  BUN 15 13  CREATININE 0.75 0.78  CALCIUM 9.8 9.3  MG  --  1.6  PHOS  --  3.0   GFR Estimated Creatinine Clearance: 63.1 ml/min (by C-G formula based on Cr of 0.78). Liver Function Tests:  Recent Labs Lab 10/18/13 1950 10/19/13 0300  AST 24 20  ALT 21 19  ALKPHOS 71 63  BILITOT 0.9 0.9  PROT 5.7* 5.2*  ALBUMIN 2.3* 2.1*   Coagulation profile  Recent Labs Lab 10/18/13 1950  INR 1.51*    CBC:  Recent Labs Lab 10/18/13 1950 10/19/13 0300  WBC 8.1 7.4  NEUTROABS 6.6  --   HGB 10.6* 9.9*  HCT 32.9* 30.1*  MCV 83.3 83.4  PLT 107* 100*   Cardiac Enzymes:  Recent Labs Lab 10/19/13 0300 10/19/13 0730  TROPONINI <0.30 <0.30   Thyroid function studies  Recent Labs  10/19/13 0300  TSH 0.894   Anemia  work up  Recent Labs  10/19/13 0300  RETICCTPCT 3.0    Studies:  Dg Chest 1 View  10/18/2013   CLINICAL DATA:  Weakness.  EXAM: CHEST - 1 VIEW  COMPARISON:  10/03/2013.  FINDINGS: The cardiac silhouette remains borderline enlarged. Mildly prominent interstitial markings with improvement. Minimal bilateral pleural thickening or fluid. Thoracic spine degenerative changes.  IMPRESSION: 1. Mild chronic interstitial lung disease with probable resolution of superimposed mild interstitial pulmonary edema.  2.  Minimal bilateral pleural thickening or fluid.   Electronically Signed   By: Enrique Sack M.D.   On: 10/18/2013 20:49   Ct Head Wo Contrast  10/18/2013   CLINICAL DATA:  Altered mental status, weakness, metastatic lung cancer  EXAM: CT HEAD WITHOUT CONTRAST  TECHNIQUE: Contiguous axial images were obtained from the base of the skull through the vertex without intravenous contrast.  COMPARISON:  None.  FINDINGS: No evidence of parenchymal hemorrhage or extra-axial fluid collection. No mass lesion, mass effect, or midline shift.  No CT evidence of acute infarction.  Subcortical white matter and periventricular small vessel ischemic changes. Intracranial atherosclerosis.  Global cortical atrophy  with prominent frontal extra-axial space. No ventriculomegaly.  The visualized paranasal sinuses are essentially clear. The mastoid air cells are unopacified.  No evidence of calvarial fracture.  IMPRESSION: No evidence of acute intracranial abnormality.  Atrophy with small vessel ischemic changes.   Electronically Signed   By: Julian Hy M.D.   On: 10/18/2013 20:39   Ct Angio Chest W/cm &/or Wo Cm  10/19/2013   CLINICAL DATA:  Progressive weakness and confusion. History of lung cancer with bone metastasis. Radiation treatment to the sternum and pelvis.  EXAM: CT ANGIOGRAPHY CHEST WITH CONTRAST  TECHNIQUE: Multidetector CT imaging of the chest was performed using the standard protocol during bolus  administration of intravenous contrast. Multiplanar CT image reconstructions and MIPs were obtained to evaluate the vascular anatomy.  CONTRAST:  120m OMNIPAQUE IOHEXOL 350 MG/ML SOLN  COMPARISON:  DG CHEST 1 VIEW dated 10/18/2013; CT CHEST W/CM dated 09/11/2013  FINDINGS: Technically adequate study with good opacification of the central and segmental pulmonary arteries. No focal filling defects are demonstrated. No evidence of significant pulmonary embolus.  Right paratracheal, pretracheal, left paratracheal, and subcarinal lymphadenopathy similar prior study. Right hilar mass with postobstructive consolidation in the right lung base. Small right pleural effusion is increasing since previous study. Atelectasis in the left lung base with small left pleural effusion, new since previous study. Multiple bilateral pulmonary nodules, greatest in the apex. Largest of these is probably in the left apex, measuring about 9 mm diameter. Visualized pulmonary nodules have increased in number, size and conspicuity since previous study, consistent with progression. Limited inclusion of upper abdominal organs. There is an enlarging lesion in the right upper abdomen medially which is incompletely included on the study and now measures 17 mm diameter. Degenerative changes in the thoracic spine. There is an expansile destructive mass in the mid sternum, increasing in size since prior study. Expansile and destructive right rib lesion is increasing in size.  Review of the MIP images confirms the above findings.  IMPRESSION: No evidence of significant pulmonary embolus. Progression of metastatic disease since prior study as described.   Electronically Signed   By: WLucienne CapersM.D.   On: 10/19/2013 00:24    Assessment/Plan: 80y.o.  1. Stage IV NSCLC (Adenocarinoma) with multiple osseus metastases  --We reviewed his recent pathology from biopsy done by Dr. MGeroge Basemanwhich is consistent with the above diagnosis and  molecular  testing is negative for EGFR and ALK. Patient's functional status is poor, frailty and nutritional status and now has complications from a sacral stage III ulcer making aggressive chemotherapy not an option.   --Please consult palliative/hospice care.We will focus on best supportive therapy.  We will discuss code status in am tomorrow when his family is present and he has his hearing aides.   2. Sacral Decubitus ulcer. --Consult wound care.   3. R hip pain secondary to #1.     4. Dehydration/ Poor functional status/ Anorexia. --Continue hydration.   5. Anemia/Thrombocytopenia. --Likely secondary to #1. Asymptomatic presently.   6. Hypokalemia, mild -- His hypokalemia likely secondary to poor nutrition. He denies vomiting and diarrhea presently. Imodium prn and anti-emetics.  7. AMS. --Likely delirium based on negative CT of head.  Correct electrolytes and hydration status and  monitor medicines.   8. Disposition.  Full. Patient will likely need hospice facility upon discharge.   Patient lives with elderly wife who cannot assist in his care.   DConcha Norway MD 10/19/2013  11:26 AM

## 2013-10-19 NOTE — H&P (Signed)
PCP:  Rogelia Boga, MD  Oncologist Dr. Rosie Fate  Chief Complaint:  confusion  HPI: [name] is a 80 y.o. male   has a past medical history of CORONARY ARTERY DISEASE (12/26/2006); HYPERLIPIDEMIA (12/26/2006); HYPERTENSION (12/26/2006); PERIPHERAL VASCULAR DISEASE (12/26/2006); Allergy; Anxiety; Arthritis; Bone metastases; and Lung cancer.   Presented with  Patient was diagnosed with metastatic lung cancer with mets to the hip. Patient have not been a candidate for aggressive chemotherapy due to weakness but have finished palliative radiation treatments to his pelvis and sternum. Patient have developed decubitus ulcer that has been treated at home with family and Aid. Family states he have not eaten today. His family lives near by. He had some low grade fevers up to 99.4. UA was negative for UTI,   Hospitalist was called for admission for  Confusion and tachycardia with probable dehydration  Review of Systems:    Pertinent positives include: fatigue, confusion  Constitutional:  No weight loss, night sweats, Fevers, chills,  weight loss  HEENT:  No headaches, Difficulty swallowing,Tooth/dental problems,Sore throat,  No sneezing, itching, ear ache, nasal congestion, post nasal drip,  Cardio-vascular:  No chest pain, Orthopnea, PND, anasarca, dizziness, palpitations.no Bilateral lower extremity swelling  GI:  No heartburn, indigestion, abdominal pain, nausea, vomiting, diarrhea, change in bowel habits, loss of appetite, melena, blood in stool, hematemesis Resp:  no shortness of breath at rest. No dyspnea on exertion, No excess mucus, no productive cough, No non-productive cough, No coughing up of blood.No change in color of mucus.No wheezing. Skin:  no rash or lesions. No jaundice GU:  no dysuria, change in color of urine, no urgency or frequency. No straining to urinate.  No flank pain.  Musculoskeletal:  No joint pain or no joint swelling. No decreased range of motion. No  back pain.  Psych:  No change in mood or affect. No depression or anxiety. No memory loss.  Neuro: no localizing neurological complaints, no tingling, no weakness, no double vision, no gait abnormality, no slurred speech, no confusion  Otherwise ROS are negative except for above, 10 systems were reviewed  Past Medical History: Past Medical History  Diagnosis Date  . CORONARY ARTERY DISEASE 12/26/2006  . HYPERLIPIDEMIA 12/26/2006  . HYPERTENSION 12/26/2006  . PERIPHERAL VASCULAR DISEASE 12/26/2006  . Allergy   . Anxiety   . Arthritis     hands  . Bone metastases   . Lung cancer     lung mets adenocarcinoma/bone mets   Past Surgical History  Procedure Laterality Date  . Abdominal aortic aneurysm repair    . Transurethral resection of prostate    . Hernia repair      ingunial  . Cardiac catheterization    . Intramedullary (im) nail intertrochanteric Left 12/03/2012    Procedure: INTRAMEDULLARY (IM) NAIL INTERTROCHANTRIC;  Surgeon: Toni Arthurs, MD;  Location: [facility];  Service: Orthopedics;  Laterality: Left;     Medications: Prior to Admission medications   Medication Sig Start Date End Date Taking? Authorizing Provider  acetaminophen (TYLENOL) 500 MG tablet Take 1,000 mg by mouth every 6 (six) hours as needed for mild pain.   Yes Historical Provider, MD  emollient (BIAFINE) cream Apply 1 application topically 2 (two) times daily. Apply to affected skin areas after rad tx and bedtime 10/07/13  Yes Jonna Coup, MD  HYDROcodone-acetaminophen [facility]) 10-325 MG per tablet Take 1 tablet by mouth every 6 (six) hours as needed. 10/18/13  Yes Myra Rude, MD  potassium chloride SA (K-DUR,KLOR-CON) 20  MEQ tablet Take 1 tablet (20 mEq total) by mouth daily. 10/10/13  Yes Myra Rude, MD    Allergies:   Allergies  Allergen Reactions  . Cardura [Doxazosin Mesylate] Other (See Comments)    Upset stomach with (GENERIC  doxazosin only)    Social History:   bed bound with lift chair Lives  at home alone    reports that he quit smoking about 35 years ago. His smoking use included Cigarettes. He smoked 0.50 packs per day. He has never used smokeless tobacco. He reports that he drinks about .6 ounces of alcohol per week. He reports that he does not use illicit drugs.    Family History: family history includes Diabetes in his mother; Hypertension in his mother.    Physical Exam: Patient Vitals for the past 24 hrs:  BP Temp Temp src Pulse Resp SpO2  10/18/13 2144 124/70 mmHg 99.4 F (37.4 C) Oral 133 23 97 %  10/18/13 1856 139/70 mmHg 99 F (37.2 C) Oral 125 21 97 %    1. General: Wincing in pain 2. Psychological: Alert slightly Oriented 3. Head/ENT:     Dry Mucous Membranes                          Head Non traumatic, neck supple                            Poor Dentition 4. SKIN:   decreased Skin turgor,  Skin clean Dry decubitus ulcer with dressing in place 5. Heart: Regular rate and rhythm no Murmur, Rub or gallop but rapid 6. Lungs:  no wheezes or crackles  diminished 7. Abdomen: Soft, non-tender, Non distended, periumbilical hernia noted. Firm nodule over right rib border consistent with history of met 8. Lower extremities: no clubbing, cyanosis, or edema 9. Neurologically Grossly intact, moving all 4 extremities equally 10. MSK: Normal range of motion  body mass index is unknown because there is no weight on file.   Labs on Admission:   Recent Labs  10/18/13 1950  NA 143  K 3.4*  CL 102  CO2 28  GLUCOSE 112*  BUN 15  CREATININE 0.75  CALCIUM 9.8    Recent Labs  10/18/13 1950  AST 24  ALT 21  ALKPHOS 71  BILITOT 0.9  PROT 5.7*  ALBUMIN 2.3*   No results found for this basename: LIPASE, AMYLASE,  in the last 72 hours  Recent Labs  10/18/13 1950  WBC 8.1  NEUTROABS 6.6  HGB 10.6*  HCT 32.9*  MCV 83.3  PLT 107*   No results found for this basename: CKTOTAL, CKMB, CKMBINDEX, TROPONINI,  in the last 72 hours No results found for this  basename: TSH, T4TOTAL, FREET3, T3FREE, THYROIDAB,  in the last 72 hours No results found for this basename: VITAMINB12, FOLATE, FERRITIN, TIBC, IRON, RETICCTPCT,  in the last 72 hours No results found for this basename: HGBA1C    The CrCl is unknown because both a height and weight (above a minimum accepted value) are required for this calculation. ABG No results found for this basename: phart, pco2, po2, hco3, tco2, acidbasedef, o2sat     No results found for this basename: DDIMER     Other results:  I have pearsonaly reviewed this: ECG REPORT  Rate: 125  Rhythm: sinus tachycardia ST&T Change: no ischemic changes   UA no evidence of UTI  BNP (last 3  results) No results found for this basename: PROBNP,  in the last 8760 hours  There were no vitals filed for this visit.   Cultures: No results found for this basename: sdes, specrequest, cult, reptstatus     Radiological Exams on Admission: Dg Chest 1 View  10/18/2013   CLINICAL DATA:  Weakness.  EXAM: CHEST - 1 VIEW  COMPARISON:  10/03/2013.  FINDINGS: The cardiac silhouette remains borderline enlarged. Mildly prominent interstitial markings with improvement. Minimal bilateral pleural thickening or fluid. Thoracic spine degenerative changes.  IMPRESSION: 1. Mild chronic interstitial lung disease with probable resolution of superimposed mild interstitial pulmonary edema.  2.  Minimal bilateral pleural thickening or fluid.   Electronically Signed   By: Enrique Sack M.D.   On: 10/18/2013 20:49   Ct Head Wo Contrast  10/18/2013   CLINICAL DATA:  Altered mental status, weakness, metastatic lung cancer  EXAM: CT HEAD WITHOUT CONTRAST  TECHNIQUE: Contiguous axial images were obtained from the base of the skull through the vertex without intravenous contrast.  COMPARISON:  None.  FINDINGS: No evidence of parenchymal hemorrhage or extra-axial fluid collection. No mass lesion, mass effect, or midline shift.  No CT evidence of acute  infarction.  Subcortical white matter and periventricular small vessel ischemic changes. Intracranial atherosclerosis.  Global cortical atrophy with prominent frontal extra-axial space. No ventriculomegaly.  The visualized paranasal sinuses are essentially clear. The mastoid air cells are unopacified.  No evidence of calvarial fracture.  IMPRESSION: No evidence of acute intracranial abnormality.  Atrophy with small vessel ischemic changes.   Electronically Signed   By: Julian Hy M.D.   On: 10/18/2013 20:39    Chart has been reviewed  Assessment/Plan  80 year old gentleman with a history of lung cancer diffuse metastatic disease to the bones here with poor pain control, tachycardia, dehydration, poor by mouth intake, and confusion being admitted for further evaluation  Present on Admission:  . Metastatic lung cancer (metastasis from lung to other site) diffuse bony metastases. CT scan done showed worsening disease. Please discuss with oncology in the morning regarding her further plan of care  . Tachycardia - likely multifactorial patient appears to be uncomfortable and in pain, he is also appears to be very dehydrated clinically. Give IV fluids and attempt to control pain. Obtain TSH and cycle cardiac enzymes.  Marland Kitchen HYPERTENSION - currently stable beta blocker could be started if blood pressure noted to be elevated  . Bone metastases - pain control  . Dehydration - administer IV fluids  . Decubital ulcer - wound care Debility - PT OT evaluation patient may benefit from placement Confusion - likely secondary to dehydration but given history of lung cancer father is no improvement would obtain MRI of the brain Prophylaxis:   Lovenox, Protonix  CODE STATUS:  FULL CODE family to discuss further   Other plan as per orders.  I have spent a total of 55 min on this admission  [name] 10/19/2013, 12:19 AM

## 2013-10-19 NOTE — Progress Notes (Signed)
Echocardiogram 2D Echocardiogram has been performed.  [name] 10/19/2013, 3:58 PM

## 2013-10-19 NOTE — Progress Notes (Addendum)
[facility] PROGRESS NOTE  [name] CHY:[id number] DOB: 1928-12-12 DOA: 10/18/2013 PCP: Nyoka Cowden, MD  Assessment/Plan: 1. Metastatic Lung cancer with bony  Mets: - progressive disease, not a candidate for any treatment.  - oncology saw pt and recommended palliative care. - palliative consulted.  - pain control   2. Hypertension: - better controlled.   Decubitus ulcer: Wound care consulted.   Confusion:  - unclear etiology.  - CT head doe snot show any acute stoke or  Masses.   Tachycardia  From cancer  DVT prophylaxis.   Code Status: full code Family Communication: discussed with family abedside Disposition Plan: pending.    Consultants:  Oncology  Palliative care  Procedures:  echo  Antibiotics:  none  HPI/Subjective: Pt confused and denies any pain.   Objective: Filed Vitals:   10/19/13 1348  BP: 118/72  Pulse: 126  Temp: 97.9 F (36.6 C)  Resp: 22    Intake/Output Summary (Last 24 hours) at 10/19/13 1635 Last data filed at 10/19/13 0700  Gross per 24 hour  Intake   1200 ml  Output    400 ml  Net    800 ml   Filed Weights   10/19/13 0149  Weight: 66.906 kg (147 lb 8 oz)    Exam:   General:  Alert confused  Cardiovascular: s1s2 tachycardic  Respiratory: scattered rhonchi. On oxygen  Abdomen: soft NT ND BS+  Musculoskeletal: no pedal edema.   Data Reviewed: Basic Metabolic Panel:  Recent Labs Lab 10/18/13 1950 10/19/13 0300  NA 143 143  K 3.4* 3.2*  CL 102 104  CO2 28 28  GLUCOSE 112* 120*  BUN 15 13  CREATININE 0.75 0.78  CALCIUM 9.8 9.3  MG  --  1.6  PHOS  --  3.0   Liver Function Tests:  Recent Labs Lab 10/18/13 1950 10/19/13 0300  AST 24 20  ALT 21 19  ALKPHOS 71 63  BILITOT 0.9 0.9  PROT 5.7* 5.2*  ALBUMIN 2.3* 2.1*   No results found for this basename: LIPASE, AMYLASE,  in the last 168 hours No results found for this basename: AMMONIA,  in the last 168  hours CBC:  Recent Labs Lab 10/18/13 1950 10/19/13 0300  WBC 8.1 7.4  NEUTROABS 6.6  --   HGB 10.6* 9.9*  HCT 32.9* 30.1*  MCV 83.3 83.4  PLT 107* 100*   Cardiac Enzymes:  Recent Labs Lab 10/19/13 0300 10/19/13 0730 10/19/13 1350  TROPONINI <0.30 <0.30 <0.30   BNP (last 3 results) No results found for this basename: PROBNP,  in the last 8760 hours CBG: No results found for this basename: GLUCAP,  in the last 168 hours  No results found for this or any previous visit (from the past 240 hour(s)).   Studies: Dg Chest 1 View  10/18/2013   CLINICAL DATA:  Weakness.  EXAM: CHEST - 1 VIEW  COMPARISON:  10/03/2013.  FINDINGS: The cardiac silhouette remains borderline enlarged. Mildly prominent interstitial markings with improvement. Minimal bilateral pleural thickening or fluid. Thoracic spine degenerative changes.  IMPRESSION: 1. Mild chronic interstitial lung disease with probable resolution of superimposed mild interstitial pulmonary edema.  2.  Minimal bilateral pleural thickening or fluid.   Electronically Signed   By: Enrique Sack M.D.   On: 10/18/2013 20:49   Ct Head Wo Contrast  10/18/2013   CLINICAL DATA:  Altered mental status, weakness, metastatic lung cancer  EXAM: CT HEAD WITHOUT CONTRAST  TECHNIQUE: Contiguous axial images were obtained  from the base of the skull through the vertex without intravenous contrast.  COMPARISON:  None.  FINDINGS: No evidence of parenchymal hemorrhage or extra-axial fluid collection. No mass lesion, mass effect, or midline shift.  No CT evidence of acute infarction.  Subcortical white matter and periventricular small vessel ischemic changes. Intracranial atherosclerosis.  Global cortical atrophy with prominent frontal extra-axial space. No ventriculomegaly.  The visualized paranasal sinuses are essentially clear. The mastoid air cells are unopacified.  No evidence of calvarial fracture.  IMPRESSION: No evidence of acute intracranial abnormality.   Atrophy with small vessel ischemic changes.   Electronically Signed   By: Julian Hy M.D.   On: 10/18/2013 20:39   Ct Angio Chest W/cm &/or Wo Cm  10/19/2013   CLINICAL DATA:  Progressive weakness and confusion. History of lung cancer with bone metastasis. Radiation treatment to the sternum and pelvis.  EXAM: CT ANGIOGRAPHY CHEST WITH CONTRAST  TECHNIQUE: Multidetector CT imaging of the chest was performed using the standard protocol during bolus administration of intravenous contrast. Multiplanar CT image reconstructions and MIPs were obtained to evaluate the vascular anatomy.  CONTRAST:  141mL OMNIPAQUE IOHEXOL 350 MG/ML SOLN  COMPARISON:  DG CHEST 1 VIEW dated 10/18/2013; CT CHEST W/CM dated 09/11/2013  FINDINGS: Technically adequate study with good opacification of the central and segmental pulmonary arteries. No focal filling defects are demonstrated. No evidence of significant pulmonary embolus.  Right paratracheal, pretracheal, left paratracheal, and subcarinal lymphadenopathy similar prior study. Right hilar mass with postobstructive consolidation in the right lung base. Small right pleural effusion is increasing since previous study. Atelectasis in the left lung base with small left pleural effusion, new since previous study. Multiple bilateral pulmonary nodules, greatest in the apex. Largest of these is probably in the left apex, measuring about 9 mm diameter. Visualized pulmonary nodules have increased in number, size and conspicuity since previous study, consistent with progression. Limited inclusion of upper abdominal organs. There is an enlarging lesion in the right upper abdomen medially which is incompletely included on the study and now measures 17 mm diameter. Degenerative changes in the thoracic spine. There is an expansile destructive mass in the mid sternum, increasing in size since prior study. Expansile and destructive right rib lesion is increasing in size.  Review of the MIP images  confirms the above findings.  IMPRESSION: No evidence of significant pulmonary embolus. Progression of metastatic disease since prior study as described.   Electronically Signed   By: Lucienne Capers M.D.   On: 10/19/2013 00:24    Scheduled Meds: . aspirin EC  81 mg Oral Daily  . docusate sodium  100 mg Oral BID  . enoxaparin (LOVENOX) injection  40 mg Subcutaneous Q24H  . potassium chloride SA  20 mEq Oral Daily  . PRUTECT  1 application Topical BID  . sodium chloride  3 mL Intravenous Q12H   Continuous Infusions: . sodium chloride 1,000 mL (10/19/13 0943)    Active Problems:   HYPERTENSION   Bone metastases   Metastatic lung cancer (metastasis from lung to other site)   Tachycardia   Dehydration   Decubital ulcer    Time spent: 35 min    Hosie Poisson  [facility] Pager [phone]. If 7PM-7AM, please contact night-coverage at [email or web address], password [facility] 10/19/2013, 4:35 PM  LOS: 1 day

## 2013-10-19 NOTE — Evaluation (Signed)
Physical Therapy Evaluation Patient Details Name: [name] MRN: [id number] DOB: 07/10/1928 Today's Date: 10/19/2013   History of Present Illness    Pt has history of lung cancer with mets to the bone. He has been receiving radiation treatment to the sternum and pelvis area for paliative care. Pt is scheduled to see Dr Lorelle Formosa on Friday to see if there are any other treatment options. He has been getting progressively weaker and getting confused   Clinical Impression  Pt presents with functional mobility limitations 2* generalized weakness, R hip LE pain and balance deficits in standing. Pt is generally pleasant and cooperative but with communication somewhat limited by hearing deficits.  Pt would greatly benefit from follow up rehab at SNF level to maximize IND and safety prior to d/c home with limited assist.     Follow Up Recommendations SNF    Equipment Recommendations  None recommended by PT    Recommendations for Other Services OT consult     Precautions / Restrictions Precautions Precautions: Fall Restrictions Weight Bearing Restrictions: No      Mobility  Bed Mobility Overal bed mobility: Needs Assistance;+2 for physical assistance Bed Mobility: Supine to Sit     Supine to sit: Mod assist;+2 for physical assistance     General bed mobility comments: cues for sequence and use of L LE and UEs to self assist.  Physical assist to manage R LE, to bring pt to EOB on drop pad and to bring trunk to upright sitting  Transfers Overall transfer level: Needs assistance Equipment used: Rolling walker (2 wheeled) Transfers: Sit to/from Stand Sit to Stand: +2 physical assistance;Mod assist         General transfer comment: cues for transition position, LE management and use of UEs to self assist  Ambulation/Gait Ambulation/Gait assistance: +2 physical assistance;Min assist;Mod assist Ambulation Distance (Feet): 13 Feet Assistive device: Rolling walker (2  wheeled) Gait Pattern/deviations: Step-to pattern;Decreased step length - right;Decreased step length - left;Shuffle;Antalgic;Trunk flexed Gait velocity: decr   General Gait Details: Cues for posture, sequence, position from RW.  PHysical assist for support, balance and RW management  Stairs            Wheelchair Mobility    Modified Rankin (Stroke Patients Only)       Balance Overall balance assessment: Needs assistance Sitting-balance support: Bilateral upper extremity supported     Postural control: Posterior lean Standing balance support: Bilateral upper extremity supported   Standing balance comment: Unstable all directions                             Pertinent Vitals/Pain 5/10 with activity.    Home Living Family/patient expects to be discharged to:: Private residence Living Arrangements: Spouse/significant other;Children Available Help at Discharge: Family             Additional Comments: Pt is planning on going to rehab following hospital stay    Prior Function Level of Independence: Independent   Gait / Transfers Assistance Needed: Physical assist for all mobility tasks and very limited ambulation just prior to admit           Hand Dominance   Dominant Hand: Right    Extremity/Trunk Assessment   Upper Extremity Assessment: Overall WFL for tasks assessed           Lower Extremity Assessment: RLE deficits/detail;LLE deficits/detail RLE Deficits / Details: Pt limited by c/o hip pain - daughter states he has  difficulty WB on R LE 2* pain.  AAROM at R hip to 80 flex and 15 abd  with 2/5 strength LLE Deficits / Details: 3+/5 strength with ROM WFL  Cervical / Trunk Assessment: Kyphotic  Communication   Communication: HOH  Cognition Arousal/Alertness: Awake/alert Behavior During Therapy: WFL for tasks assessed/performed Overall Cognitive Status: Difficult to assess                      General Comments       Exercises General Exercises - Lower Extremity Ankle Circles/Pumps: AROM;Both;10 reps;Supine Heel Slides: AAROM;15 reps;Right;Supine Hip ABduction/ADduction: AAROM;10 reps;Supine;Right      Assessment/Plan    PT Assessment Patient needs continued PT services  PT Diagnosis Difficulty walking   PT Problem List Decreased strength;Decreased range of motion;Decreased activity tolerance;Decreased mobility;Decreased knowledge of use of DME;Pain;Decreased safety awareness;Decreased balance  PT Treatment Interventions DME instruction;Gait training;Functional mobility training;Therapeutic activities;Therapeutic exercise;Patient/family education   PT Goals (Current goals can be found in the Care Plan section) Acute Rehab PT Goals Patient Stated Goal: Move without pain PT Goal Formulation: With patient Time For Goal Achievement: 11/02/13 Potential to Achieve Goals: Good    Frequency Min 3X/week   Barriers to discharge Decreased caregiver support For saftey, pt requires 24/7 assist.  Wife is unable to physcially assist    Co-evaluation               End of Session Equipment Utilized During Treatment: Gait belt Activity Tolerance: Patient tolerated treatment well;Patient limited by pain;Patient limited by fatigue Patient left: in chair;with call bell/phone within reach;with family/visitor present Nurse Communication: Mobility status         Time: 8657-8469 PT Time Calculation (min): 44 min   Charges:   PT Evaluation $Initial PT Evaluation Tier I: 1 Procedure PT Treatments $Gait Training: 8-22 mins $Therapeutic Exercise: 8-22 mins $Therapeutic Activity: 8-22 mins   PT G Codes:          Mathis Fare 10/19/2013, 12:47 PM

## 2013-10-19 NOTE — Evaluation (Signed)
Occupational Therapy Evaluation Patient Details Name: [name] MRN: [id number] DOB: 23-Mar-1929 Today's Date: 10/19/2013    History of Present Illness Pt has history of lung cancer with mets to the bone. He has been receiving radiation treatment to the sternum and pelvis area for paliative care. He has been getting progressively weaker and getting confused   Clinical Impression   Pt demonstrates decline in function and safety with ADLs and ADL moobility with decreased strength, balance, endurance and cognition. Pt would benefit from acute OT services to address impairments to increase level of function and safety. Pt's family would like him to d/c to a SNF for rehab before return home. Pt's family very concerned as to the amount of physical assist they have had to provide him recently and also that pt's wife has dementia and he and family were caring for her as well    Follow Up Recommendations  SNF;Supervision/Assistance - 24 hour    Equipment Recommendations  None recommended by OT;Other (comment) (TBD at next venue of care)    Recommendations for Other Services       Precautions / Restrictions Precautions Precautions: Fall Restrictions Weight Bearing Restrictions: No      Mobility Bed Mobility Overal bed mobility: Needs Assistance;+2 for physical assistance Bed Mobility: Supine to Sit     Supine to sit: Mod assist;+2 for physical assistance     General bed mobility comments: pt up in recliner  Transfers Overall transfer level: Needs assistance Equipment used: Rolling walker (2 wheeled) Transfers: Sit to/from Stand Sit to Stand: +2 physical assistance;Mod assist         General transfer comment: NT, pt requires +2 physical assist. Per PT note, pt is mod A +2    Balance Overall balance assessment: Needs assistance Sitting-balance support: Bilateral upper extremity supported;Single extremity supported;Feet supported Sitting balance-Leahy Scale: Poor    Postural control: Posterior lean Standing balance support: Bilateral upper extremity supported   Standing balance comment: Unstable all directions                            ADL Overall ADL's : Needs assistance/impaired     Grooming: Wash/dry hands;Wash/dry face;Set up;Sitting;Cueing for sequencing   Upper Body Bathing: Minimal assitance;Cueing for sequencing;Sitting   Lower Body Bathing: Maximal assistance;Sitting/lateral leans;Cueing for sequencing   Upper Body Dressing : Minimal assistance;Cueing for sequencing;Sitting   Lower Body Dressing: Total assistance     Toilet Transfer Details (indicate cue type and reason): NT, pt requires +2 physical assist. Per PT note, pt is mod A +2         Functional mobility during ADLs: Moderate assistance;+2 for physical assistance General ADL Comments: Pt is confused and trying to pull O2 out of nose and pulling on IV in L UE and required verbal cues and on 3 ocassions during session. Pt required increased timeto complete tasks due to being [facility] and slow processing     Vision  wears reading glasses per family report                              Pertinent Vitals/Pain Pain with movement, did not rate. VSS     Hand Dominance Right   Extremity/Trunk Assessment Upper Extremity Assessment Upper Extremity Assessment: Generalized weakness   Lower Extremity Assessment Lower Extremity Assessment: Defer to PT evaluation RLE Deficits / Details: Pt limited by c/o hip pain - daughter  states he has difficulty WB on R LE 2* pain.  AAROM at R hip to 80 flex and 15 abd  with 2/5 strength RLE Coordination: decreased fine motor LLE Deficits / Details: 3+/5 strength with ROM WFL LLE Coordination: decreased fine motor   Cervical / Trunk Assessment Cervical / Trunk Assessment: Kyphotic   Communication Communication Communication: HOH   Cognition Arousal/Alertness: Awake/alert Behavior During Therapy: WFL for tasks  assessed/performed Overall Cognitive Status: Impaired/Different from baseline Area of Impairment: Orientation;Memory;Safety/judgement                   General Comments   Pt pleasantly confused and cooperative                Home Living Family/patient expects to be discharged to:: Skilled nursing facility Living Arrangements: Spouse/significant other;Children Available Help at Discharge: Family                             Additional Comments: Pt is planning on going to rehab following hospital stay      Prior Functioning/Environment Level of Independence: Independent  Gait / Transfers Assistance Needed: Physical assist for all mobility tasks and very limited ambulation just prior to admit          OT Diagnosis: Generalized weakness;Cognitive deficits   OT Problem List: Decreased strength;Decreased activity tolerance;Impaired balance (sitting and/or standing);Decreased cognition;Decreased safety awareness;Decreased knowledge of use of DME or AE   OT Treatment/Interventions: Self-care/ADL training;Therapeutic exercise;Patient/family education;Neuromuscular education;Balance training;Therapeutic activities;DME and/or AE instruction    OT Goals(Current goals can be found in the care plan section) Acute Rehab OT Goals Patient Stated Goal: get better OT Goal Formulation: With patient/family Time For Goal Achievement: 10/26/13 Potential to Achieve Goals: Good ADL Goals Pt Will Perform Grooming: with min assist;standing (with +2 assist at sink) Pt Will Perform Upper Body Bathing: with min guard assist;with supervision;with set-up;sitting (with min verbal cues) Pt Will Perform Lower Body Bathing: with mod assist;sitting/lateral leans;sit to/from stand Pt Will Perform Upper Body Dressing: with min guard assist;with supervision;with set-up;sitting (with min verbal cues) Pt Will Transfer to Toilet: with min assist;with +2 assist;bedside commode  OT Frequency: Min  2X/week   Barriers to D/C: Decreased caregiver support                        End of Session    Activity Tolerance: Patient limited by fatigue Patient left: in chair;with call bell/phone within reach;with family/visitor present   Time: 3220-2542 OT Time Calculation (min): 24 min Charges:  OT General Charges $OT Visit: 1 Procedure OT Evaluation $Initial OT Evaluation Tier I: 1 Procedure OT Treatments $Therapeutic Activity: 8-22 mins G-Codes:    Mosetta Putt 11-02-13, 1:11 PM

## 2013-10-20 MED ORDER — POTASSIUM CHLORIDE CRYS ER 20 MEQ PO TBCR
40.0000 meq | EXTENDED_RELEASE_TABLET | Freq: Once | ORAL | Status: AC
  Administered 2013-10-20: 40 meq via ORAL
  Filled 2013-10-20: qty 2

## 2013-10-20 MED ORDER — BOOST PLUS PO LIQD
237.0000 mL | Freq: Three times a day (TID) | ORAL | Status: DC
  Administered 2013-10-20 – 2013-10-23 (×9): 237 mL via ORAL
  Filled 2013-10-20 (×10): qty 237

## 2013-10-20 MED ORDER — SODIUM CHLORIDE 0.9 % IV SOLN
1000.0000 mL | INTRAVENOUS | Status: DC
  Administered 2013-10-20: 1000 mL via INTRAVENOUS
  Administered 2013-10-21: 250 mL via INTRAVENOUS

## 2013-10-20 NOTE — Progress Notes (Signed)
INITIAL NUTRITION ASSESSMENT  Patient meets the criteria for severe MALNUTRITION in the context of chronic illness with 14% weight loss in 4-5 months and PO intake <75% of estimated needs.   DOCUMENTATION CODES Per approved criteria  -Severe malnutrition in the context of chronic illness   INTERVENTION: - Boost Shake TID between meals, provide small amounts (4 oz) frequently per family request, each provides 360 kcal, 14 g protein - Recommend diet liberalization to REGULAR to promote increased PO intake.   NUTRITION DIAGNOSIS: Inadequate oral intake related to stage 4 lung cancer as evidenced by <50% meal intake.   Goal: Patient will meet >/=90% of estimated nutrition needs  Monitor:  PO intake, weight, labs, GOC  Reason for Assessment: Consult  80 y.o. male  Admitting Dx: Stage 4 Lung Cancer  ASSESSMENT: 80 year old male patient with stage 4 non-small cell lung cancer with bone metastases and stage 3 sacral decubitus ulcer. Aggressive chemotherapy not an option.   Patient with family present in the room. They report that he has had poor PO intake. They have just started giving him Boost supplements at home. He is not able to tolerate large amounts so they give him about 4 oz every few hours. He has lost about 14% of his usual weight in 4-5 months. Per oncology, life expectancy about 6 months. Palliative consulted, [facility] meeting scheduled for tomorrow morning.   Height: Ht Readings from Last 1 Encounters:  10/19/13 5\' 6"  (1.676 m)    Weight: Wt Readings from Last 1 Encounters:  10/19/13 147 lb 8 oz (66.906 kg)    Ideal Body Weight: 142 pounds  % Ideal Body Weight: 104%  Wt Readings from Last 10 Encounters:  10/19/13 147 lb 8 oz (66.906 kg)  10/03/13 164 lb (74.39 kg)  09/19/13 165 lb (74.844 kg)  08/20/13 173 lb (78.472 kg)  08/07/13 173 lb (78.472 kg)  07/12/13 171 lb (77.565 kg)  07/08/13 168 lb (76.204 kg)  06/10/13 169 lb 9.6 oz (76.93 kg)  05/28/13 171 lb  (77.565 kg)  04/19/13 177 lb (80.287 kg)    Usual Body Weight: 170 pounds  % Usual Body Weight: 86%  BMI:  Body mass index is 23.82 kg/(m^2).  Patient is normal weight  Estimated Nutritional Needs: Kcal: 1700-1850 kcal Protein: 95-105 g Fluid: >2.0 L/day  Skin: Stage 3 sacral decubitus ulcer  Diet Order: Cardiac  EDUCATION NEEDS: -No education needs identified at this time   Intake/Output Summary (Last 24 hours) at 10/20/13 1512 Last data filed at 10/20/13 1251  Gross per 24 hour  Intake 4766.25 ml  Output    700 ml  Net 4066.25 ml    Last BM: 5/3   Labs:   Recent Labs Lab 10/18/13 1950 10/19/13 0300  NA 143 143  K 3.4* 3.2*  CL 102 104  CO2 28 28  BUN 15 13  CREATININE 0.75 0.78  CALCIUM 9.8 9.3  MG  --  1.6  PHOS  --  3.0  GLUCOSE 112* 120*    CBG (last 3)  No results found for this basename: GLUCAP,  in the last 72 hours  Scheduled Meds: . aspirin EC  81 mg Oral Daily  . docusate sodium  100 mg Oral BID  . enoxaparin (LOVENOX) injection  40 mg Subcutaneous Q24H  . potassium chloride SA  20 mEq Oral Daily  . PRUTECT  1 application Topical BID  . sodium chloride  3 mL Intravenous Q12H    Continuous Infusions: .  sodium chloride 1,000 mL (10/20/13 0946)    Past Medical History  Diagnosis Date  . CORONARY ARTERY DISEASE 12/26/2006  . HYPERLIPIDEMIA 12/26/2006  . HYPERTENSION 12/26/2006  . PERIPHERAL VASCULAR DISEASE 12/26/2006  . Allergy   . Anxiety   . Arthritis     hands  . Bone metastases   . Lung cancer     lung mets adenocarcinoma/bone mets    Past Surgical History  Procedure Laterality Date  . Abdominal aortic aneurysm repair    . Transurethral resection of prostate    . Hernia repair      ingunial  . Cardiac catheterization    . Intramedullary (im) nail intertrochanteric Left 12/03/2012    Procedure: INTRAMEDULLARY (IM) NAIL INTERTROCHANTRIC;  Surgeon: Wylene Simmer, MD;  Location: [facility];  Service: Orthopedics;  Laterality: Left;     Larey Seat, RD, LDN Pager #: [phone] After-Hours Pager #: [phone]

## 2013-10-20 NOTE — Progress Notes (Signed)
Pt has had 2 mostly liquid brownish REDDISH stools.  THe first stool was guiac negative but it had obvious blood.  Do you want [place] to guiac another stool?

## 2013-10-20 NOTE — Progress Notes (Signed)
Patient ID: [name], male   DOB: 16-Dec-1928, 80 y.o.   MRN: [id number]  [facility] PROGRESS NOTE  [name] ASN:[id number] DOB: 09-22-1928 DOA: 10/18/2013 PCP: Nyoka Cowden, MD  Brief narrative: Patient is 80 yo male with metastatic lung cancer with mets to the hip, presented to [facility] ED with main concern of progressively worsening confusion, failure to thrive. Patient has developed decubitus ulcer that has been treated at home by family and nursing aid.  Active Problems:   Metastatic lung cancer (metastasis from lung to other site) - appreciate oncology and PCT input and assistance - continue to provide supportive care, possible d/c home with hospice in next 24 - 48 hours   Tachycardia - still present, continue to monitor, pt denies chest pain or shortness of breath this AM    Anemia of chronic disease - Hg and Hct stable and at baseline  - CBC in AM   Hypokalemia - will supplement and repeat BMP in AM   Dehydration - advance diet as pt able to tolerate    Decubital ulcer - continue wound care   Severe malnutrition - secondary to progressive illness and noted above  - advance diet as pt able to tolerate   Consultants:  Oncology  Palliative care team   Procedures/Studies: Dg Chest 1 View   10/18/2013  Mild chronic interstitial lung disease with probable resolution of superimposed mild interstitial pulmonary edema. Minimal bilateral pleural thickening or fluid.   Ct Head Wo Contrast  10/18/2013   No evidence of acute intracranial abnormality.  Atrophy with small vessel ischemic changes.  Ct Angio Chest W/cm &/or Wo Cm  10/19/2013  No evidence of significant pulmonary embolus. Progression of metastatic disease since prior study as described.  Antibiotics:  None   Code Status: DNR Family Communication: Pt and daughter at bedside Disposition Plan: Remains inpatient   HPI/Subjective: No events overnight.   Objective: Filed Vitals:   10/19/13 0500  10/19/13 1348 10/19/13 2123 10/20/13 0558  BP: 132/71 118/72 131/62 123/68  Pulse: 115 126 132 115  Temp: 98.8 F (37.1 C) 97.9 F (36.6 C) 98.3 F (36.8 C) 98.4 F (36.9 C)  TempSrc: Oral Oral Oral Oral  Resp: 18 22 22 20   Height:      Weight:      SpO2: 98% 97% 98% 98%    Intake/Output Summary (Last 24 hours) at 10/20/13 1257 Last data filed at 10/20/13 1251  Gross per 24 hour  Intake 5006.25 ml  Output    700 ml  Net 4306.25 ml    Exam:   General:  Pt is alert, follows commands appropriately, HOH   Cardiovascular: Regular rhythm, tachycardic, S1/S2, no murmurs, no rubs, no gallops  Respiratory: Clear to auscultation bilaterally, rhonchi bilaterally and mostly at bases   Abdomen: Soft, non tender, non distended, bowel sounds present, no guarding  Data Reviewed: Basic Metabolic Panel:  Recent Labs Lab 10/18/13 1950 10/19/13 0300  NA 143 143  K 3.4* 3.2*  CL 102 104  CO2 28 28  GLUCOSE 112* 120*  BUN 15 13  CREATININE 0.75 0.78  CALCIUM 9.8 9.3  MG  --  1.6  PHOS  --  3.0   Liver Function Tests:  Recent Labs Lab 10/18/13 1950 10/19/13 0300  AST 24 20  ALT 21 19  ALKPHOS 71 63  BILITOT 0.9 0.9  PROT 5.7* 5.2*  ALBUMIN 2.3* 2.1*    CBC:  Recent Labs Lab 10/18/13 1950 10/19/13 0300  WBC 8.1 7.4  NEUTROABS 6.6  --   HGB 10.6* 9.9*  HCT 32.9* 30.1*  MCV 83.3 83.4  PLT 107* 100*   Cardiac Enzymes:  Recent Labs Lab 10/19/13 0300 10/19/13 0730 10/19/13 1350  TROPONINI <0.30 <0.30 <0.30   BNP: No components found with this basename: POCBNP,  CBG: No results found for this basename: GLUCAP,  in the last 168 hours  No results found for this or any previous visit (from the past 240 hour(s)).   Scheduled Meds: . aspirin EC  81 mg Oral Daily  . docusate sodium  100 mg Oral BID  . enoxaparin (LOVENOX) injection  40 mg Subcutaneous Q24H  . potassium chloride SA  20 mEq Oral Daily  . PRUTECT  1 application Topical BID  . sodium  chloride  3 mL Intravenous Q12H   Continuous Infusions: . sodium chloride 1,000 mL (10/20/13 0946)     Theodis Blaze, MD  [facility] Pager [phone]  If 7PM-7AM, please contact night-coverage [email or web address] Password [email or web address] 10/20/2013, 12:57 PM   LOS: 2 days

## 2013-10-20 NOTE — Progress Notes (Signed)
Patient DK:[id number] A Uram      DOB: [birth year]      VQQ:[id number] Full note to  follow:  Met breifly this am before Dr. Juliann Mule had arrived. Patient able to communicate his needs but at times is a little confused.  Daughter reported they were going to have one more meeting with Dr. Juliann Mule.  We agreed that we would meet at 8 am Monday to formalize goals of care so that they would have the opportunity to her Dr. Boyce Medici information.  Noted that he has been by since my departure and this note.  Will plan to assist with disposition in the AM with hospice care.     Recommend:  1. Agree with DNR  2.  Pain: family doesn't want him to get pain meds unless he absolutely needs them, ok with current prn medications  3.  Will need to address nutrition issue   Total time: 03-1033

## 2013-10-20 NOTE — Consult Note (Signed)
Patient [name]      DOB: Jan 06, 1929      JEH:[id number]     Consult Note from the Palliative Medicine Team at [facility] Requested by: Dr . Olen Pel    PCP: Nyoka Cowden, MD Reason for Consultation: [place] [facility]    Phone Number:[phone]  Assessment of patients Current state: Patient is an 80 yr old white male with a known diagnosis of lung cancer now metastatic to pelvis/hip confirmed with biopsy.  Family has been working with Dr. Juliann Mule on goals of care as he is too frail for continued curative care. Patient was admitted with decreased po intake and failing to thrive with low grade fever and confusion. Focus of infection not identified.  Family states they know that he is declining. They are leaning toward comfort care.  They would like to speak with Dr. Juliann Mule again before we go forward.  [name] also does not have his hearing aides today so he can not effectively participate.  For now , his daughter states they are trying to limit his pain medication as he wants to be awake and alert. We talked about the trade off since he appears to being having significant pain.   Goals of Care: 1.  Code Status: DNR   2. Scope of Treatment: For now family desires prn pain medication.  They agree with DNR . They agree to talk with me in am when his hearing aids are back in place.  Prn pain meds.  4. Disposition: to  Be determined obviously declining.  Likely a hospice facility candidate   3. Symptom Management:   1. Anxiety/Agitation: not an issue but with the delirium/ encephalopathy may use prn haldol 2. Pain: prn morphine 3.  Loose stools.  4. Psychosocial: He was from [place] .  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   5. Spiritual: Active in the [employer].       Patient Documents Completed or Given: Document Given Completed  Advanced Directives Pkt    MOST    DNR    Gone  from [facility]    Hard Choices      Brief HPI: 80 yr old white male who had been attempting to rehab but was limited by increased hip and pelvis pain.  Patient with a diagnosis of metastatic lung cancer.  Biopsy  showed metastatic process.  We were asked to assist with goals of care.  ROS: not able to assist in goals of care due to confusion.    PMH:  Past Medical History  Diagnosis Date  . CORONARY ARTERY DISEASE 12/26/2006  . HYPERLIPIDEMIA 12/26/2006  . HYPERTENSION 12/26/2006  . PERIPHERAL VASCULAR DISEASE 12/26/2006  . Allergy   . Anxiety   . Arthritis     hands  . Bone metastases   . Lung cancer     lung mets adenocarcinoma/bone mets     PSH: Past Surgical History  Procedure Laterality Date  . Abdominal aortic aneurysm repair    . Transurethral resection of prostate    . Hernia repair      ingunial  . Cardiac catheterization    . Intramedullary (im) nail intertrochanteric Left 12/03/2012    Procedure: INTRAMEDULLARY (IM) NAIL INTERTROCHANTRIC;  Surgeon: Wylene Simmer, MD;  Location: [facility];  Service: Orthopedics;  Laterality: Left;   I have reviewed the [place] and SH and  If appropriate update it with  new information. Allergies  Allergen Reactions  . Cardura [Doxazosin Mesylate] Other (See Comments)    Upset stomach with (GENERIC  doxazosin only)   Scheduled Meds: . aspirin EC  81 mg Oral Daily  . docusate sodium  100 mg Oral BID  . enoxaparin (LOVENOX) injection  40 mg Subcutaneous Q24H  . potassium chloride SA  20 mEq Oral Daily  . PRUTECT  1 application Topical BID  . sodium chloride  3 mL Intravenous Q12H   Continuous Infusions: . sodium chloride 1,000 mL (10/20/13 0946)   PRN Meds:.acetaminophen, acetaminophen, HYDROcodone-acetaminophen, morphine injection, ondansetron (ZOFRAN) IV, ondansetron    BP 123/68  Pulse 115  Temp(Src) 98.4 F (36.9 C) (Oral)  Resp 20  Ht 5\' 6"  (1.676 m)  Wt 66.906 kg (147 lb 8 oz)  BMI 23.82 kg/m2  SpO2 98%    PPS:30%   Intake/Output Summary (Last 24 hours) at 10/20/13 1154 Last data filed at 10/20/13 0900  Gross per 24 hour  Intake   4150 ml  Output    700 ml  Net   3450 ml    Physical Exam:  General: severe hearing loss, slightly confused when he does hear, tangential HEENT:  PERRL,  EOMI, mm dry , bilateral hearing deficit Chest:  Decreased with some rhonchi CVS: tachycardic, S1, S2 Abdomen: soft, not distended, no guarding or rebound Ext: decreased stregth, bilateral legs, multiple areas of ecchymosis Neuro: slightly confused , otherwise able to tell is past history.   Labs: CBC    Component Value Date/Time   WBC 7.4 10/19/2013 0300   WBC 10.1 10/10/2013 1508   RBC 3.61* 10/19/2013 0300   RBC 3.61* 10/19/2013 0300   RBC 4.41 10/10/2013 1508   HGB 9.9* 10/19/2013 0300   HGB 11.8* 10/10/2013 1508   HCT 30.1* 10/19/2013 0300   HCT 36.2* 10/10/2013 1508   PLT 100* 10/19/2013 0300   PLT 190 10/10/2013 1508   MCV 83.4 10/19/2013 0300   MCV 82.2 10/10/2013 1508   MCH 27.4 10/19/2013 0300   MCH 26.7* 10/10/2013 1508   MCHC 32.9 10/19/2013 0300   MCHC 32.5 10/10/2013 1508   RDW 16.7* 10/19/2013 0300   RDW 15.3* 10/10/2013 1508   LYMPHSABS 0.6* 10/18/2013 1950   LYMPHSABS 0.9 10/10/2013 1508   MONOABS 0.8 10/18/2013 1950   MONOABS 0.8 10/10/2013 1508   EOSABS 0.0 10/18/2013 1950   EOSABS 0.1 10/10/2013 1508   BASOSABS 0.0 10/18/2013 1950   BASOSABS 0.0 10/10/2013 1508      CMP     Component Value Date/Time   NA 143 10/19/2013 0300   NA 143 10/11/2013 1355   K 3.2* 10/19/2013 0300   K 3.2* 10/11/2013 1355   CL 104 10/19/2013 0300   CO2 28 10/19/2013 0300   CO2 27 10/11/2013 1355   GLUCOSE 120* 10/19/2013 0300   GLUCOSE 132 10/11/2013 1355   GLUCOSE 101* 04/12/2006 1156   BUN 13 10/19/2013 0300   BUN 17.5 10/11/2013 1355   CREATININE 0.78 10/19/2013 0300   CREATININE 0.9 10/11/2013 1355   CALCIUM 9.3 10/19/2013 0300   CALCIUM 9.9 10/11/2013 1355   PROT 5.2* 10/19/2013 0300   PROT 6.0* 10/10/2013 1508   ALBUMIN 2.1*  10/19/2013 0300   ALBUMIN 2.6* 10/10/2013 1508   AST 20 10/19/2013 0300   AST 35* 10/10/2013 1508   ALT 19 10/19/2013 0300   ALT 34 10/10/2013 1508   ALKPHOS 63 10/19/2013 0300   ALKPHOS 82 10/10/2013 1508   BILITOT 0.9  10/19/2013 0300   BILITOT 1.06 10/10/2013 1508   GFRNONAA 81* 10/19/2013 0300   GFRAA >90 10/19/2013 0300    Chest Xray Reviewed/Impressions: 1. Mild chronic interstitial lung disease with probable resolution  of superimposed mild interstitial pulmonary edema.  2. Minimal bilateral pleural thickening or fluid.    CT scan of the chestReviewed/Impressions:  No evidence of significant pulmonary embolus. Progression of  metastatic disease since prior study as described.      Time In Time Out Total Time Spent with Patient Total Overall Time  10 am 1034 am 30 min 34 min    Greater than 50%  of this time was spent counseling and coordinating care related to the above assessment and plan.   Dorthia Tout L. Lovena Le, MD MBA The Palliative Medicine Team at [facility] Phone: [phone] Pager: [phone]

## 2013-10-20 NOTE — Consult Note (Signed)
WOC wound consult note Reason for Consult:Stage III  Pressure ulcer on left buttock near gluteal cleft Wound type:Pressure plus moisture Pressure Ulcer POA: Yes Measurement:Left buttock near gluteal cleft:  3.5cm x 3.0cm x 0.2cm.  Pink, moist ulceration with basement cell membrane evident (neogenesis).  Gluteal crease with partial thickness ulcerations measuring 2cm x 0.5cm x 0.2cm and 1cm x .5cm x 0.2cm with clean, pink and moist wound beds.. Wound bed:AS described above. Drainage (amount, consistency, odor) small amount of serous exudate on old dressing.  Not entirely sure if it is from the ulcerations of from rectal or urinary moisture seeping beneath old dressing.  Dressing removed, patient skin and ulcers are cleansed and a new dressing is applied. Periwound:Intact with blanching erythema.  Patient is positioned onto side, but immediately pushes backwards to resume a back-lying (supine) position.  Repositioning is attempted and two pillows applied to back, one additional pillow is in from of him and another is floating his heels. Dressing procedure/placement/frequency:I will recommend we continue with the soft silicone foam dressing made especially for the sacrum at this time.The ulcers are clean and granulating, this dressing protects and pads the affected tissue and is large enough to provide pressure redistribution when we are maneuvering him in the bed. Nursing staff note that there is a meeting with the family in the am to discuss goals of care and that nutrition will be a part of that discussion. Patient denies pain at this time and I am not inclined to provide a therapeutic mattress with low air loss feature for someone with confusion at this time.   [place] nursing team will not follow routinely, but will remain available to this patient, the nursing and medical teams.  Please re-consult if needed or if ulcer progress is not consistent with overall patient status. Thanks, Maudie Flakes, MSN,  RN, [place], [place], [place] [phone])

## 2013-10-20 NOTE — Progress Notes (Signed)
Medical oncology Patient had his hearing aides and his daughter and son-in-law was at bedside.  We discussed two main issues in depth.  First, we discussed his code status.  The patient desires to be DNR.  Secondly, we discussed that I recommended against aggressive chemotherapy for his NSCLC Stage IV with left upper quadrant of omental nodule, a subcutaneous soft tissue nodules in multifocal osseous metastatic disease and that we should focus on quality of life (QOL) measures through hospice/palliative care including pain control, anti-emetics. His life expectancy given the extent of his disease, functional status (Stage III sacral decubitus ulcer) and co-morbidities (CAD/PVDz, hypertension, hyperlipidemia) and physiologic age is less than 6 months.  The patient expressed understanding and agreement with palliative care through home hospice (if eligible).  Hospice/Palliative care has been consulted.

## 2013-10-21 DIAGNOSIS — C3490 Malignant neoplasm of unspecified part of unspecified bronchus or lung: Principal | ICD-10-CM

## 2013-10-21 DIAGNOSIS — E43 Unspecified severe protein-calorie malnutrition: Secondary | ICD-10-CM | POA: Insufficient documentation

## 2013-10-21 DIAGNOSIS — Z515 Encounter for palliative care: Secondary | ICD-10-CM

## 2013-10-21 DIAGNOSIS — C801 Malignant (primary) neoplasm, unspecified: Secondary | ICD-10-CM

## 2013-10-21 LAB — BASIC METABOLIC PANEL
BUN: 10 mg/dL (ref 6–23)
CO2: 25 meq/L (ref 19–32)
CREATININE: 0.7 mg/dL (ref 0.50–1.35)
Calcium: 9 mg/dL (ref 8.4–10.5)
Chloride: 108 mEq/L (ref 96–112)
GFR calc non Af Amer: 85 mL/min — ABNORMAL LOW (ref >90)
Glucose, Bld: 116 mg/dL — ABNORMAL HIGH (ref 70–99)
Potassium: 3.2 mEq/L — ABNORMAL LOW (ref 3.7–5.3)
Sodium: 144 mEq/L (ref 137–147)

## 2013-10-21 LAB — CBC
HCT: 30.3 % — ABNORMAL LOW (ref 39.0–52.0)
Hemoglobin: 9.5 g/dL — ABNORMAL LOW (ref 13.0–17.0)
MCH: 26.5 pg (ref 26.0–34.0)
MCHC: 31.4 g/dL (ref 30.0–36.0)
MCV: 84.6 fL (ref 78.0–100.0)
Platelets: 96 10*3/uL — ABNORMAL LOW (ref 150–400)
RBC: 3.58 MIL/uL — ABNORMAL LOW (ref 4.22–5.81)
RDW: 17 % — AB (ref 11.5–15.5)
WBC: 8 10*3/uL (ref 4.0–10.5)

## 2013-10-21 LAB — URINE CULTURE
COLONY COUNT: NO GROWTH
Culture: NO GROWTH

## 2013-10-21 MED ORDER — POTASSIUM CHLORIDE CRYS ER 20 MEQ PO TBCR
40.0000 meq | EXTENDED_RELEASE_TABLET | Freq: Once | ORAL | Status: AC
  Administered 2013-10-21: 40 meq via ORAL
  Filled 2013-10-21: qty 2

## 2013-10-21 NOTE — Progress Notes (Signed)
Pt continues to desat to low 80's when we attempt to wean him to RA.   On 2 - 3 l/m [place] pt sats will stay in the 90's while asleep.  Did have 3 very liquid stools but are no longer red or pink,.

## 2013-10-21 NOTE — Progress Notes (Signed)
PT Cancellation Note  Patient Details Name: [name] MRN: [id number] DOB: 10-Oct-1928   Cancelled Treatment:    Reason Eval/Treat Not Completed: Other (comment) Noted plan for residential hospice at this time; PT will sign off, please re-order if need arises   Neil Crouch 10/21/2013, 1:45 PM

## 2013-10-21 NOTE — Progress Notes (Signed)
Patient IZ:[id number] A Goza      DOB: 07-Sep-1928      DXI:[id number]   Palliative Medicine Team at [facility] Progress Note    Subjective:  Patient more confused today.  Can't really participate in goals of care meeting.  Talked with his daughter and son in law.  Patient is likely best served by Hospice facility transfer once medically stable.  Family coming to terms with this.  They have okayed obtaining information about this.     Filed Vitals:   10/21/13 0623  BP: 142/70  Pulse: 133  Temp: 99.4 F (37.4 C)  Resp: 24   Physical exam:   Generally : confused thinks he is standing up when he is lying down.  Hearing very poor even with hearing aid in. PERRL, Mm dry Chest: decreased , no rhonchi, or rales CVS: tachy irregular S1, S2 Ext warm, shaking when I came in but I think he thought he was trying to stand even though he was lying down. Neuro: pleasantly confused      Assessment and plan: 80 yr old white male with metastatic lung cancer involving spine and hip s/p radiation treatment, not a chemo candidate. Patient becoming more confused. Daughter Arville Go ok with considering [place] placement.  He is not eating much and is delirious.  1. DNR  2.  Pain: patient at times appears in pain but states no.  Daughter trying to not "use a lot of medication"  We talked about controlling pain adequately as a quality of life issue. Continue prn dosing for now.  Used no pain meds yesterday.  3.  Delirium/encepahlopathy: urine culture negative 5/1, no pneumonia mentioned on ct. Likely related to cancer and malnutrition   4. Prognosis: days to week if not eating  5.  Called SW to make hospice facility referal.   Total tome  [phone]     Liliann File L. Lovena Le, MD MBA The Palliative Medicine Team at [facility] Phone: [phone] Pager: [phone]

## 2013-10-21 NOTE — Progress Notes (Signed)
Clinical Social Work Department BRIEF PSYCHOSOCIAL ASSESSMENT 10/21/2013  Patient:  [name], [name]     Account Number:  [id number]     Admit date:  10/18/2013  Clinical Social Worker:  Renold Genta  Date/Time:  10/21/2013 11:59 AM  Referred by:  Physician  Date Referred:  10/21/2013 Referred for  Residential hospice placement   Other Referral:   Interview type:  Family Other interview type:   patient's daughter & son in law at bedside    PSYCHOSOCIAL DATA Living Status:   Admitted from facility:   Level of care:   Primary support name:  Lurlean Nanny (dtr) c#: [phone] Primary support relationship to patient:  CHILD, ADULT Degree of support available:   good    CURRENT CONCERNS Current Concerns  Post-Acute Placement   Other Concerns:    SOCIAL WORK ASSESSMENT / PLAN CSW received call from Dr. Lovena Le that patient is appropriate for [facility] placement & family is agreeable.   Assessment/plan status:  Information/Referral to [employer] Other assessment/ plan:   Information/referral to community resources:   CSW made referral to [facility] - awaiting responses re: bed availability/eligibility.    PATIENT'S/FAMILY'S RESPONSE TO PLAN OF CARE: [name] & son-in-law state that they are agreeable with the plan for Residential Hospice and that both [facility] and [facility] are equal distance away from where they live. Daughter states that she has to run errands today but will be available by phone, seemed anxious about the possibilty of him being discharged today.       Raynaldo Opitz, [facility] Clinical Social Worker cell #: [phone]

## 2013-10-21 NOTE — Progress Notes (Signed)
[facility]  Patient Status: Active (receiving services up to time of hospitalization)  [facility] is providing the following services: RN, PT, OT, MSW and HHA  If patient discharges after hours, please call [phone].   Lurlean Leyden 10/21/2013, 10:35 AM

## 2013-10-21 NOTE — Progress Notes (Signed)
Patient ID: [name], male   DOB: [birth year], 80 y.o.   MRN: [id number]  [facility] PROGRESS NOTE  [name]:[id number] DOB: 12-10-1928 DOA: 10/18/2013 PCP: Nyoka Cowden, MD  Brief narrative:  Patient is 80 yo male with metastatic lung cancer with mets to the hip, presented to [facility] ED with main concern of progressively worsening confusion, failure to thrive. Patient has developed decubitus ulcer that has been treated at home by family and nursing aid.   Active Problems:  Metastatic lung cancer (metastasis from lung to other site)  - appreciate oncology and PCT input and assistance  - continue to provide supportive care, possible d/c home with hospice in next 24 - 48 hours  - pt now DNR Tachycardia  - still present, continue to monitor, pt denies chest pain or shortness of breath this AM  Anemia of chronic disease  - Hg and Hct stable and at baseline  Hypokalemia  - continue to supplement as indicated  Dehydration  - advance diet as pt able to tolerate  Decubital ulcer  - continue wound care  Severe malnutrition  - secondary to progressive illness and noted above  - advance diet as pt able to tolerate   Consultants:  Oncology  Palliative care team  Procedures/Studies:  Dg Chest 1 View 10/18/2013 Mild chronic interstitial lung disease with probable resolution of superimposed mild interstitial pulmonary edema. Minimal bilateral pleural thickening or fluid.  Ct Head Wo Contrast 10/18/2013 No evidence of acute intracranial abnormality. Atrophy with small vessel ischemic changes.  Ct Angio Chest W/cm &/or Wo Cm 10/19/2013 No evidence of significant pulmonary embolus. Progression of metastatic disease since prior study as described. Antibiotics:  None   Code Status: DNR  Family Communication: Pt and daughter at bedside  Disposition Plan: Remains inpatient   HPI/Subjective: No events overnight.   Objective: Filed Vitals:   10/20/13 1336 10/20/13 2055  10/21/13 0623 10/21/13 1320  BP: 117/71 129/70 142/70 146/54  Pulse: 112 122 133 125  Temp: 98.4 F (36.9 C) 98.5 F (36.9 C) 99.4 F (37.4 C) 98 F (36.7 C)  TempSrc: Oral Oral Oral Axillary  Resp: 22 22 24 24   Height:      Weight:      SpO2: 94% 96% 93% 100%    Intake/Output Summary (Last 24 hours) at 10/21/13 1457 Last data filed at 10/21/13 1440  Gross per 24 hour  Intake 1530.42 ml  Output   2052 ml  Net -521.58 ml    Exam:   General:  Pt is somnolent but fairly easy to arouse,  not in acute distress  Cardiovascular: Regular rate and rhythm, no rubs, no gallops  Respiratory: Rhonchi at bases, diminished air movement bilaterally   Abdomen: Soft, tender in epigastric area, hernia, bowel sounds present, no guarding  Extremities: No edema, pulses DP and PT palpable bilaterally  Data Reviewed: Basic Metabolic Panel:  Recent Labs Lab 10/18/13 1950 10/19/13 0300 10/21/13 0423  NA 143 143 144  K 3.4* 3.2* 3.2*  CL 102 104 108  CO2 28 28 25   GLUCOSE 112* 120* 116*  BUN 15 13 10   CREATININE 0.75 0.78 0.70  CALCIUM 9.8 9.3 9.0  MG  --  1.6  --   PHOS  --  3.0  --    Liver Function Tests:  Recent Labs Lab 10/18/13 1950 10/19/13 0300  AST 24 20  ALT 21 19  ALKPHOS 71 63  BILITOT 0.9 0.9  PROT 5.7* 5.2*  ALBUMIN  2.3* 2.1*   CBC:  Recent Labs Lab 10/18/13 1950 10/19/13 0300 10/21/13 0423  WBC 8.1 7.4 8.0  NEUTROABS 6.6  --   --   HGB 10.6* 9.9* 9.5*  HCT 32.9* 30.1* 30.3*  MCV 83.3 83.4 84.6  PLT 107* 100* 96*   Cardiac Enzymes:  Recent Labs Lab 10/19/13 0300 10/19/13 0730 10/19/13 1350  TROPONINI <0.30 <0.30 <0.30     Recent Results (from the past 240 hour(s))  URINE CULTURE     Status: None   Collection Time    10/18/13  9:28 PM      Result Value Ref Range Status   Specimen Description URINE, CATHETERIZED   Final   Special Requests NONE   Final   Culture  Setup Time     Final   Value: 10/19/2013 02:11     Performed at  [place]     Final   Value: NO GROWTH     Performed at [facility]   Culture     Final   Value: NO GROWTH     Performed at [facility]   Report Status 10/21/2013 FINAL   Final     Scheduled Meds: . aspirin EC  81 mg Oral Daily  . docusate sodium  100 mg Oral BID  . enoxaparin (LOVENOX) injection  40 mg Subcutaneous Q24H  . lactose free nutrition  237 mL Oral TID WC  . potassium chloride SA  20 mEq Oral Daily  . PRUTECT  1 application Topical BID  . sodium chloride  3 mL Intravenous Q12H   Continuous Infusions: . sodium chloride 1,000 mL (10/20/13 1822)    Theodis Blaze, MD  [facility] Pager [phone]  If 7PM-7AM, please contact night-coverage [email or web address] Password [email or web address] 10/21/2013, 2:57 PM   LOS: 3 days

## 2013-10-22 LAB — BASIC METABOLIC PANEL
BUN: 11 mg/dL (ref 6–23)
CALCIUM: 9.5 mg/dL (ref 8.4–10.5)
CO2: 27 meq/L (ref 19–32)
CREATININE: 0.7 mg/dL (ref 0.50–1.35)
Chloride: 108 mEq/L (ref 96–112)
GFR calc Af Amer: >90 mL/min (ref >90)
GFR calc non Af Amer: 85 mL/min — ABNORMAL LOW (ref >90)
GLUCOSE: 123 mg/dL — AB (ref 70–99)
Potassium: 4 mEq/L (ref 3.7–5.3)
Sodium: 144 mEq/L (ref 137–147)

## 2013-10-22 LAB — CBC
HCT: 30.6 % — ABNORMAL LOW (ref 39.0–52.0)
HEMOGLOBIN: 9.7 g/dL — AB (ref 13.0–17.0)
MCH: 27 pg (ref 26.0–34.0)
MCHC: 31.7 g/dL (ref 30.0–36.0)
MCV: 85.2 fL (ref 78.0–100.0)
Platelets: 78 10*3/uL — ABNORMAL LOW (ref 150–400)
RBC: 3.59 MIL/uL — AB (ref 4.22–5.81)
RDW: 17.6 % — ABNORMAL HIGH (ref 11.5–15.5)
WBC: 9.1 10*3/uL (ref 4.0–10.5)

## 2013-10-22 NOTE — Progress Notes (Signed)
Patient UU:[id number] A Mickelsen      DOB: 1928/12/15      KJZ:[id number]  Daughter Mechele Claude ready to make the transition to hospice facility.  She requested that I check with Dr. Juliann Mule regarding his availability to act as hospice attending.  Dr. Juliann Mule glad to do so per our conversation.  Patient with new hearing device. Still pleasantly delirious.  Family coming to terms with prognosis.  Prn pain meds being used as family did not "want him doped up".     Lorrain Rivers L. Lovena Le, MD MBA The Palliative Medicine Team at [facility] Phone: [phone] Pager: [phone]

## 2013-10-22 NOTE — Discharge Instructions (Signed)

## 2013-10-22 NOTE — Discharge Summary (Signed)
Physician Discharge Summary  [name] DVV:[id number] DOB: 06-25-1928 DOA: 10/18/2013  PCP: Nyoka Cowden, MD  Admit date: 10/18/2013 Discharge date: 10/23/2013  Recommendations for Outpatient Follow-up:  1. Pt will need to follow up with PCP as needed 2. Plan to discharge to [facility] in AM   Discharge Diagnoses:   Metastatic lung cancer (metastasis from lung to other site) Active Problems:   HYPERTENSION   Bone metastases   Metastatic lung cancer (metastasis from lung to other site)   Tachycardia   Dehydration   Decubital ulcer   Protein-calorie malnutrition, severe  Discharge Condition: Stable  Diet recommendation: Heart healthy diet discussed in details   Brief narrative:  Patient is 80 yo male with metastatic lung cancer with mets to the hip, presented to [facility] ED with main concern of progressively worsening confusion, failure to thrive. Patient has developed decubitus ulcer that has been treated at home by family and nursing aid.   Active Problems:  Metastatic lung cancer (metastasis from lung to other site)  - appreciate oncology and PCT input and assistance  - continue to provide supportive care, possible d/c to [facility] in AM  - pt DNR  Tachycardia  - still present, pt denies chest pain or shortness of breath this AM  Anemia of chronic disease  - Hg and Hct stable and at baseline  Hypokalemia  - supplemented and WNL this AM  Dehydration  - advance diet as pt able to tolerate  Decubital ulcer  - continue wound care  Severe malnutrition  - secondary to progressive illness and noted above  - advance diet as pt able to tolerate   Consultants:  Oncology  Palliative care team  Procedures/Studies:  Dg Chest 1 View 10/18/2013 Mild chronic interstitial lung disease with probable resolution of superimposed mild interstitial pulmonary edema. Minimal bilateral pleural thickening or fluid.  Ct Head Wo Contrast 10/18/2013 No evidence of acute intracranial  abnormality. Atrophy with small vessel ischemic changes.  Ct Angio Chest W/cm &/or Wo Cm 10/19/2013 No evidence of significant pulmonary embolus. Progression of metastatic disease since prior study as described. Antibiotics:  None   Code Status: DNR  Family Communication: Pt and daughter at bedside  Disposition Plan: D/C [facility] in AM  Discharge Exam: Filed Vitals:   10/22/13 1430  BP: 135/69  Pulse: 139  Temp: 98.6 F (37 C)  Resp: 20   Filed Vitals:   10/21/13 1320 10/21/13 2230 10/22/13 0621 10/22/13 1430  BP: 146/54 132/87 151/82 135/69  Pulse: 125 135 79 139  Temp: 98 F (36.7 C) 98.7 F (37.1 C) 97.7 F (36.5 C) 98.6 F (37 C)  TempSrc: Axillary Oral Oral Oral  Resp: 24 22 22 20   Height:      Weight:      SpO2: 100% 96% 98% 91%    General: Pt is alert, follows commands appropriately, not in acute distress Cardiovascular: Regular rhythm, tachycardic, S1/S2 +, no murmurs, no rubs, no gallops Respiratory: Clear to ausculation in the upper lobes and diminished at bases  Abdominal: Soft, non tender, non distended, bowel sounds +, no guarding Extremities: no edema, no cyanosis, pulses palpable bilaterally DP and PT  Discharge Instructions  Discharge Orders   Future Appointments Provider Department Dept Phone   17-Nov-2013 3:30 PM [facility] [place] [phone]   11-17-13 4:00 PM [facility] [phone]   11/21/2013 4:15 PM Marye Round, MD  [facility] [phone]   Future Orders Complete By Expires   Diet - low sodium heart healthy  As directed    Increase activity slowly  As directed        Medication List         acetaminophen 500 MG tablet  Commonly known as:  TYLENOL  Take 1,000 mg by mouth every 6 (six) hours as needed for mild pain.     emollient cream  Commonly known as:  BIAFINE  Apply 1 application  topically 2 (two) times daily. Apply to affected skin areas after rad tx and bedtime     HYDROcodone-acetaminophen 10-325 MG per tablet  Commonly known as:  NORCO  Take 1 tablet by mouth every 6 (six) hours as needed.     potassium chloride SA 20 MEQ tablet  Commonly known as:  K-DUR,KLOR-CON  Take 1 tablet (20 mEq total) by mouth daily.           Follow-up Information   Schedule an appointment as soon as possible for a visit with Nyoka Cowden, MD. (As needed)    Specialty:  Internal Medicine   Contact information:   [address] [place] [address] [phone]        The results of significant diagnostics from this hospitalization (including imaging, microbiology, ancillary and laboratory) are listed below for reference.     Microbiology: Recent Results (from the past 240 hour(s))  URINE CULTURE     Status: None   Collection Time    10/18/13  9:28 PM      Result Value Ref Range Status   Specimen Description URINE, CATHETERIZED   Final   Special Requests NONE   Final   Culture  Setup Time     Final   Value: 10/19/2013 02:11     Performed at [place]     Final   Value: NO GROWTH     Performed at [facility]   Culture     Final   Value: NO GROWTH     Performed at [facility]   Report Status 10/21/2013 FINAL   Final     Labs: Basic Metabolic Panel:  Recent Labs Lab 10/18/13 1950 10/19/13 0300 10/21/13 0423 10/22/13 0510  NA 143 143 144 144  K 3.4* 3.2* 3.2* 4.0  CL 102 104 108 108  CO2 28 28 25 27   GLUCOSE 112* 120* 116* 123*  BUN 15 13 10 11   CREATININE 0.75 0.78 0.70 0.70  CALCIUM 9.8 9.3 9.0 9.5  MG  --  1.6  --   --   PHOS  --  3.0  --   --    Liver Function Tests:  Recent Labs Lab 10/18/13 1950 10/19/13 0300  AST 24 20  ALT 21 19  ALKPHOS 71 63  BILITOT 0.9 0.9  PROT 5.7* 5.2*  ALBUMIN 2.3* 2.1*   CBC:  Recent Labs Lab 10/18/13 1950 10/19/13 0300 10/21/13 0423  10/22/13 0510  WBC 8.1 7.4 8.0 9.1  NEUTROABS 6.6  --   --   --   HGB 10.6* 9.9* 9.5* 9.7*  HCT 32.9* 30.1* 30.3* 30.6*  MCV 83.3 83.4 84.6 85.2  PLT 107* 100* 96* 78*   Cardiac Enzymes:  Recent Labs Lab 10/19/13 0300 10/19/13 0730 10/19/13 1350  TROPONINI <0.30 <0.30 <0.30   SIGNED: Time coordinating discharge: Over 30 minutes  Theodis Blaze, MD  [facility] 10/22/2013,  4:39 PM Pager [phone]  If 7PM-7AM, please contact night-coverage [email or web address] Password [email or web address]

## 2013-10-22 NOTE — Consult Note (Signed)
[facility] Liaison: Received request from [place] for family interest in [facility]. Chart reviewed and met with patient's daughter Mechele Claude this morning. Mechele Claude is aware [facility] is available for Mr. Queenan tomorrow morning if family would like for him to transfer. Will follow up in am. Thank you. Erling Conte LCSW [phone]

## 2013-10-22 NOTE — Progress Notes (Signed)
Clinical Social Work  CSW continues to follow to assist with placement. Bed is available at [facility] on 10/23/13 if patient is stable to transport. CSW met with patient and dtr (Joann) at bedside in order to introduce myself since patient was a transfer from another unit. Dtr reports she has reviewed [facility] paperwork and discussing with family but is hopeful that patient can DC tomorrow. Dtr to contact [employer] representative Harmon Pier) to finish paperwork.  CSW will continue to follow and will assist with DC planning.   Gayle Mill, [place] [phone]

## 2013-10-23 ENCOUNTER — Telehealth: Payer: Self-pay | Admitting: Medical Oncology

## 2013-10-23 NOTE — Telephone Encounter (Signed)
Harmon Pier called from [facility] to inform us that pt will be transferring there today. She wanted to verify that Dr. Juliann Mule would be the attending and if he would like Dr. Alfonso Ramus to assist in symptom management. Per Dr. Juliann Mule he will and would also like help with symptom management. Harmon Pier voiced understanding.

## 2013-10-23 NOTE — Progress Notes (Signed)
Clinical Social Work  Patient accepted to [facility]. Patient, family, RN, and MD all aware and agreeable to plans. CSW prepared DC packet and coordinated transportation via [facility]. Request #: [id number].  Dtr thanked CSW for assistance and is happy that patient is going to hospice home for further care. Patient anxious to DC and glad that he is leaving today.   CSW is signing off but available if further needs arise.  [name], [name] [phone]

## 2013-10-23 NOTE — Progress Notes (Signed)
Patient seen and examined. Plan for DC to [facility] today. No changes required to DC Summary dated 10/22/13.  Domingo Mend, MD [facility] Pager: [phone]'

## 2013-10-24 ENCOUNTER — Other Ambulatory Visit: Payer: Medicare Other

## 2013-10-24 ENCOUNTER — Ambulatory Visit: Payer: Medicare Other

## 2013-10-24 NOTE — Progress Notes (Signed)
  Radiation Oncology         ([phone]) [phone] ________________________________  Name: [name] MRN: [id number]  Date: 11/15/2013  DOB: 1928-09-16  End of Treatment Note  Diagnosis:   Metastatic lung cancer with bony metastases     Indication for treatment:  Palliative       Radiation treatment dates:   10/02/2013 through 10/16/2013  Site/dose:   The patient was treated to 3 separate target areas. Each of these areas received 30 gray in 10 fractions at 3 gray per fraction. The sternum was treated with an en face electron field and the additional 2 areas were treated with photon treatments. 1. right hip  2. left pelvis  3. sternum   Narrative: The patient tolerated radiation treatment relatively well.   The patient did not exhibit significant toxicity including GI toxicity. No esophagitis.  Plan: The patient has completed radiation treatment. The patient will return to radiation oncology clinic for routine followup in one month. I advised the patient to call or return sooner if they have any questions or concerns related to their recovery or treatment. ________________________________  Jodelle Gross, M.D., Ph.D.

## 2013-10-25 ENCOUNTER — Ambulatory Visit: Payer: Medicare Other

## 2013-10-25 ENCOUNTER — Other Ambulatory Visit: Payer: Medicare Other

## 2013-10-25 ENCOUNTER — Encounter: Payer: Self-pay | Admitting: Medical Oncology

## 2013-10-28 ENCOUNTER — Telehealth: Payer: Self-pay

## 2013-10-28 NOTE — Telephone Encounter (Signed)
Daughter called to inform us  father died 15-Nov-2013. She wished to be called when the death certificate is signed

## 2013-11-15 ENCOUNTER — Encounter: Payer: Self-pay | Admitting: Radiation Oncology

## 2013-11-18 NOTE — Progress Notes (Signed)
Received a call from [facility] this am that pt passed away. Dr. Juliann Mule notified.

## 2013-11-18 DEATH — deceased

## 2013-11-21 ENCOUNTER — Ambulatory Visit: Payer: Medicare Other | Admitting: Radiation Oncology

## 2014-06-17 IMAGING — CT CT ANGIO CHEST
1 of 2 series · 19 of 32 positions shown · IV contrast (OMNIPAQUE 300)
Comparison: DG CHEST 1 VIEW dated 10/18/2013; CT CHEST W/CM dated
09/11/2013

CLINICAL DATA: Progressive weakness and confusion. History of lung
cancer with bone metastasis. Radiation treatment to the sternum and
pelvis.

EXAM:
CT ANGIOGRAPHY CHEST WITH CONTRAST
TECHNIQUE: Multidetector CT imaging of the chest was performed using the
standard protocol during bolus administration of intravenous
contrast. Multiplanar CT image reconstructions and MIPs were
obtained to evaluate the vascular anatomy.
CONTRAST:  100mL OMNIPAQUE IOHEXOL 350 MG/ML SOLN

[Series 5: thins for pacs · axial · 0.68mm/px · z∈[-278,-32]mm · 19 of 270 slices shown]
[im 12/270  lung]
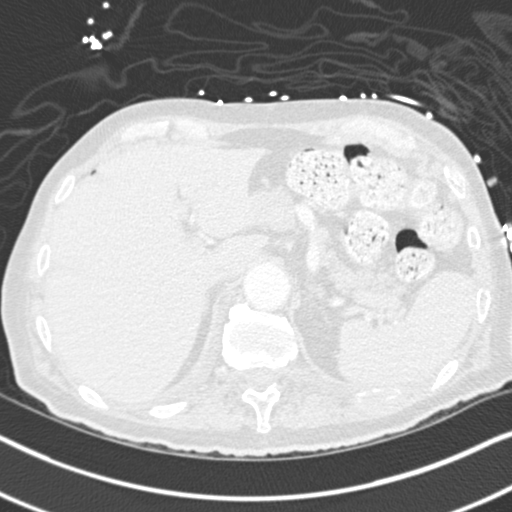
[im 24/270  soft-tissue]
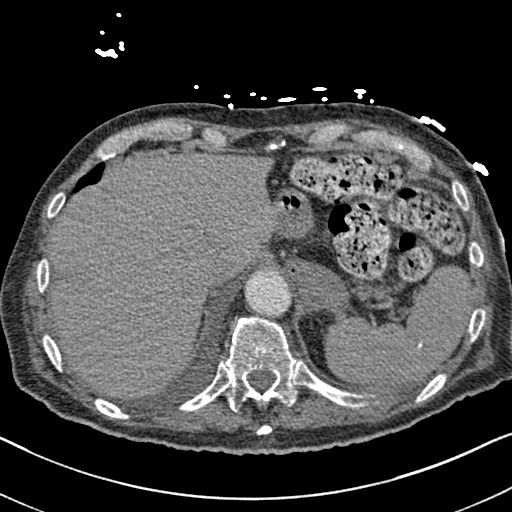
[im 36/270  lung]
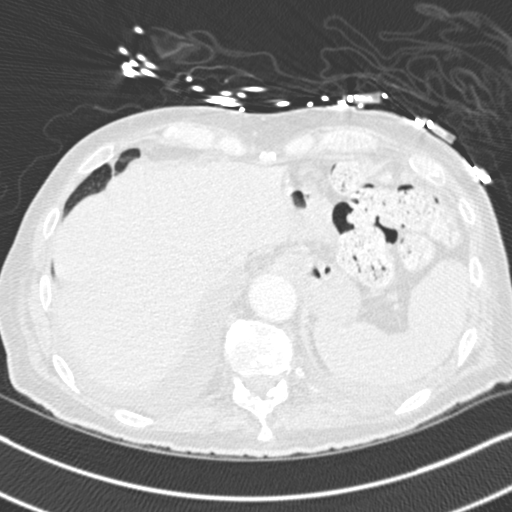
[im 59/270  soft-tissue]
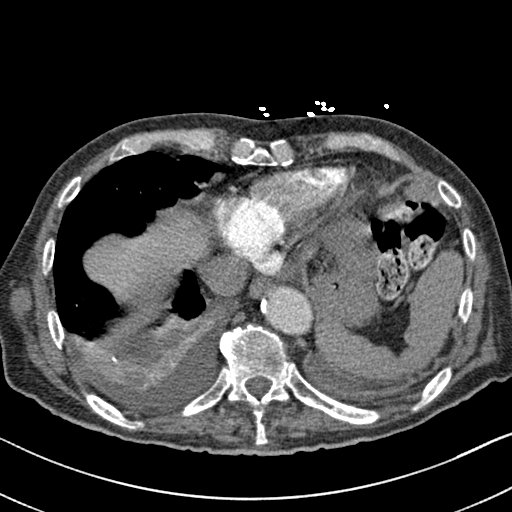
[im 71/270  lung]
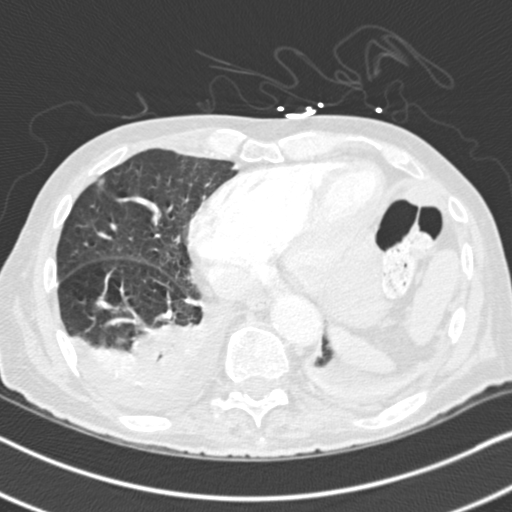
[im 82/270  soft-tissue]
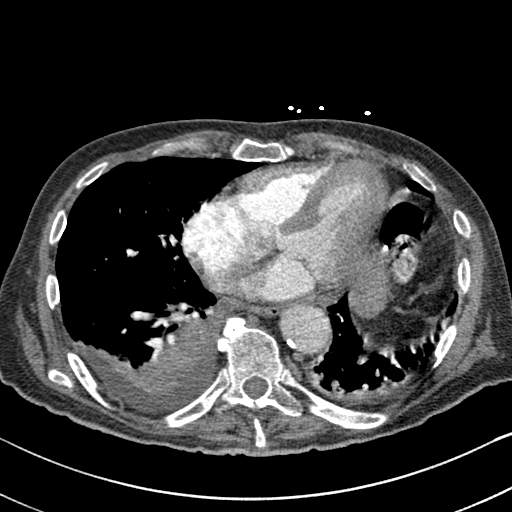
[im 94/270  lung]
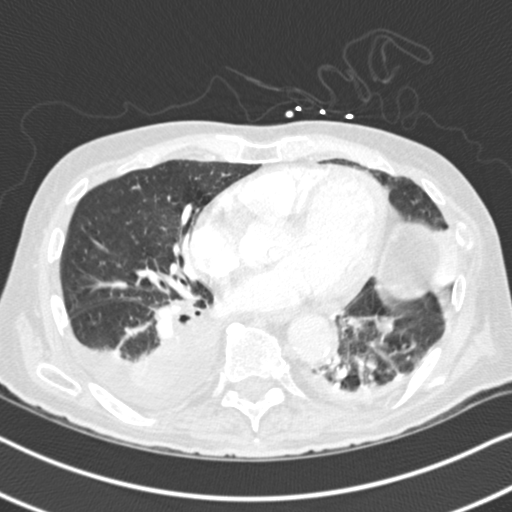
[im 106/270  soft-tissue]
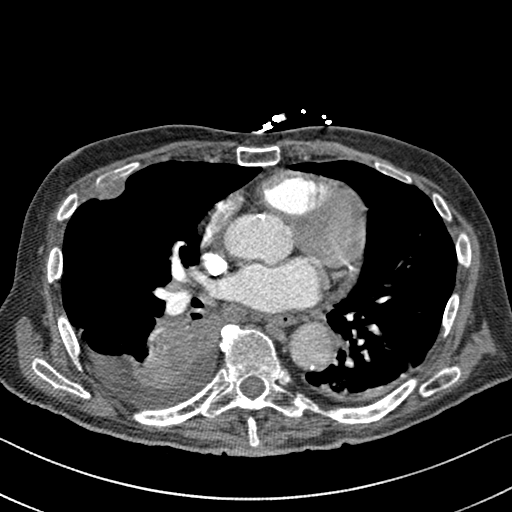
[im 117/270  lung]
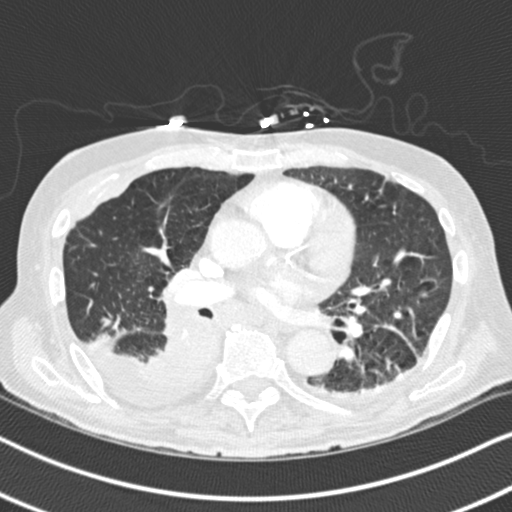
[im 141/270  soft-tissue]
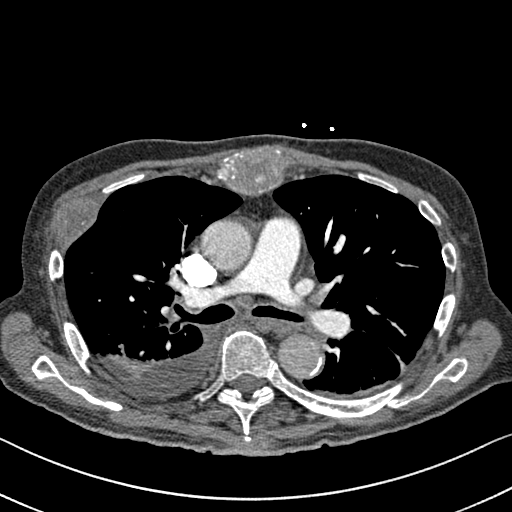
[im 153/270  lung]
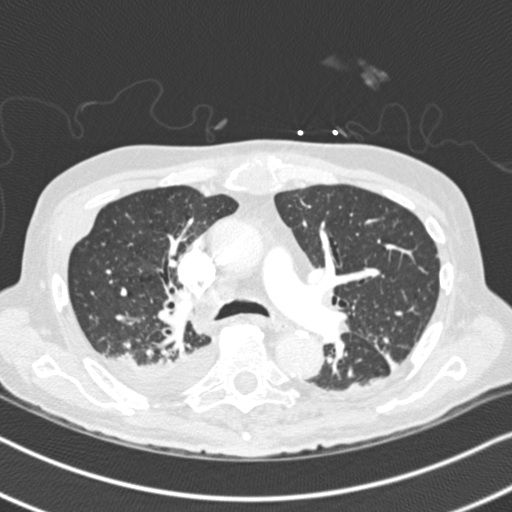
[im 164/270  soft-tissue]
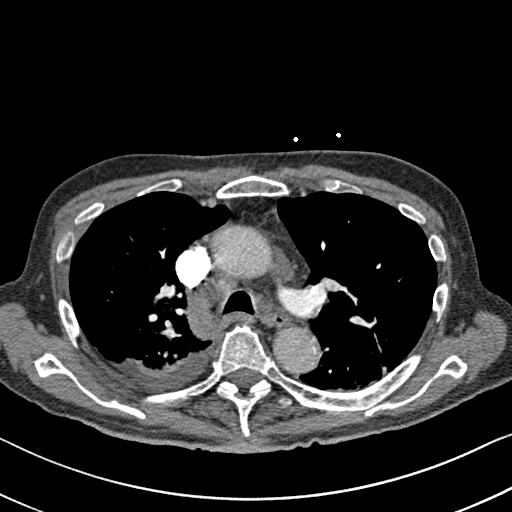
[im 176/270  lung]
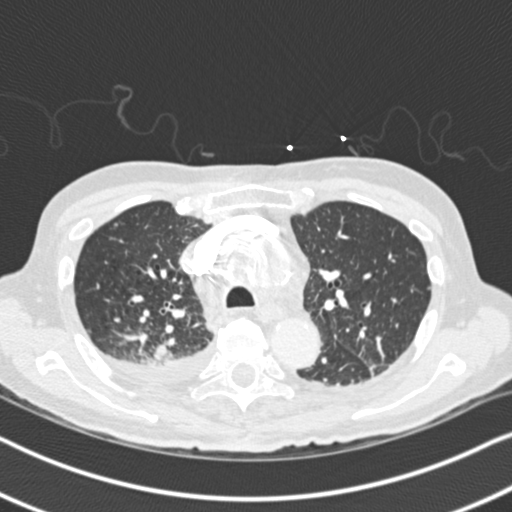
[im 188/270  soft-tissue]
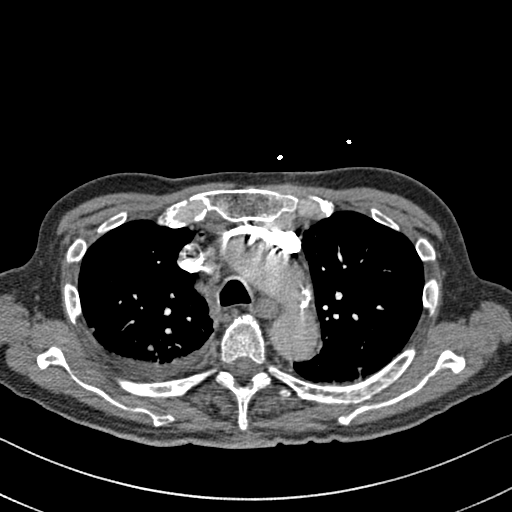
[im 199/270  lung]
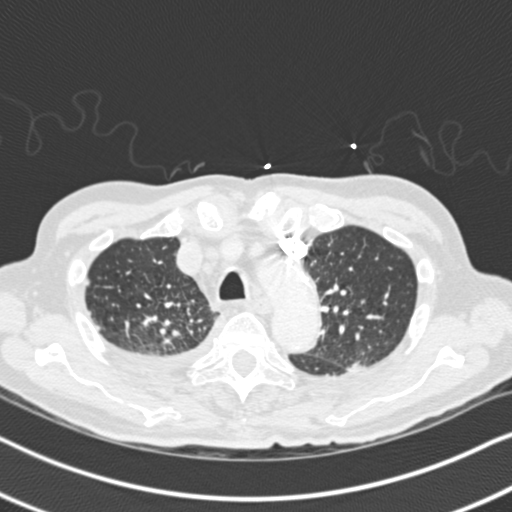
[im 211/270  soft-tissue]
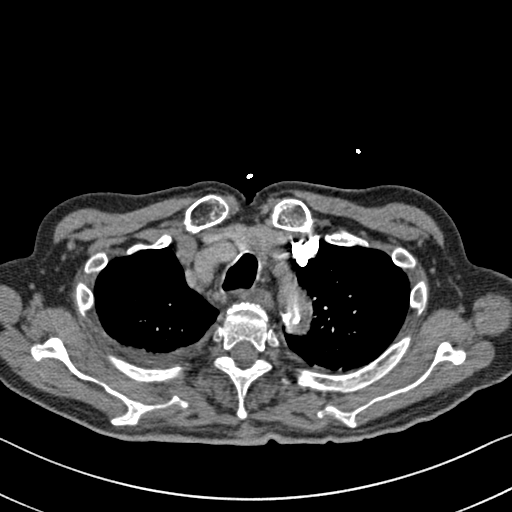
[im 234/270  lung]
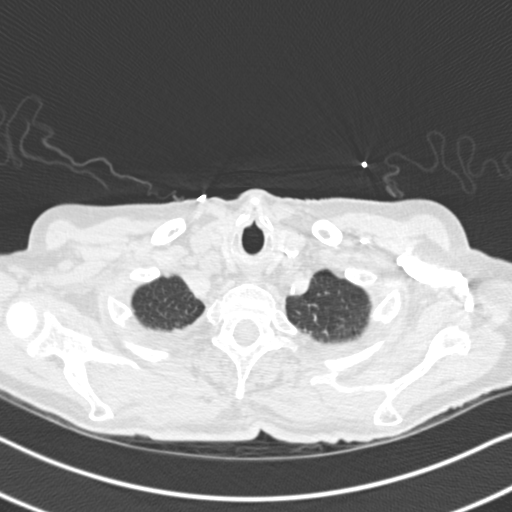
[im 246/270  soft-tissue]
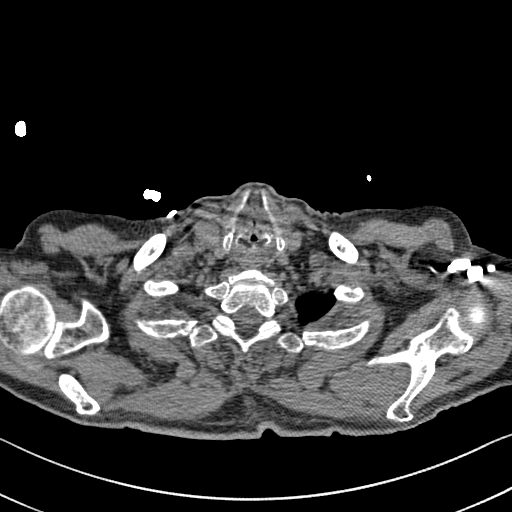
[im 258/270  lung]
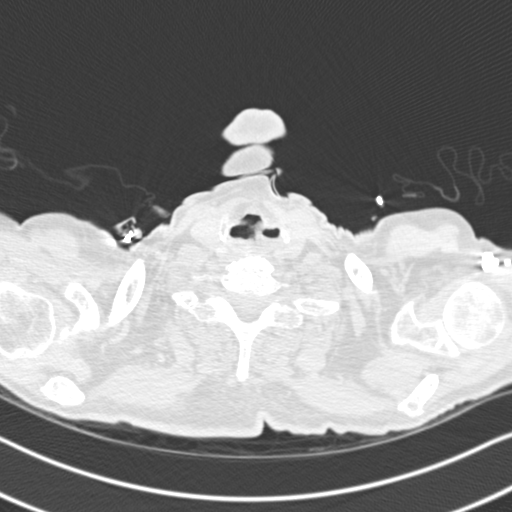

[19 of 32 positions shown; findings below may reference images not displayed]

FINDINGS: Technically adequate study with good opacification of the central
and segmental pulmonary arteries. No focal filling defects are
demonstrated. No evidence of significant pulmonary embolus.

Right paratracheal, pretracheal, left paratracheal, and subcarinal
lymphadenopathy similar prior study. Right hilar mass with
postobstructive consolidation in the right lung base. Small right
pleural effusion is increasing since previous study. Atelectasis in
the left lung base with small left pleural effusion, new since
previous study. Multiple bilateral pulmonary nodules, greatest in
the apex. Largest of these is probably in the left apex, measuring
about 9 mm diameter. Visualized pulmonary nodules have increased in
number, size and conspicuity since previous study, consistent with
progression. Limited inclusion of upper abdominal organs. There is
an enlarging lesion in the right upper abdomen medially which is
incompletely included on the study and now measures 17 mm diameter.
Degenerative changes in the thoracic spine. There is an expansile
destructive mass in the mid sternum, increasing in size since prior
study. Expansile and destructive right rib lesion is increasing in
size.

Review of the MIP images confirms the above findings.
IMPRESSION: No evidence of significant pulmonary embolus. Progression of
metastatic disease since prior study as described.
